# Patient Record
Sex: Female | Born: 1955 | Race: White | Hispanic: No | Marital: Married | Smoking: Never smoker
Health system: Southern US, Community
[De-identification: ages and names within clinical notes are randomized; demographics above are authoritative.]

## PROBLEM LIST (undated history)

## (undated) DIAGNOSIS — Z9889 Other specified postprocedural states: Secondary | ICD-10-CM

## (undated) DIAGNOSIS — L570 Actinic keratosis: Secondary | ICD-10-CM

## (undated) DIAGNOSIS — M1990 Unspecified osteoarthritis, unspecified site: Secondary | ICD-10-CM

## (undated) DIAGNOSIS — I10 Essential (primary) hypertension: Secondary | ICD-10-CM

## (undated) DIAGNOSIS — E785 Hyperlipidemia, unspecified: Secondary | ICD-10-CM

## (undated) DIAGNOSIS — R112 Nausea with vomiting, unspecified: Secondary | ICD-10-CM

## (undated) DIAGNOSIS — I6529 Occlusion and stenosis of unspecified carotid artery: Secondary | ICD-10-CM

## (undated) DIAGNOSIS — M810 Age-related osteoporosis without current pathological fracture: Secondary | ICD-10-CM

## (undated) DIAGNOSIS — I509 Heart failure, unspecified: Secondary | ICD-10-CM

## (undated) HISTORY — DX: Actinic keratosis: L57.0

## (undated) HISTORY — PX: EYE SURGERY: SHX253

## (undated) HISTORY — PX: ABDOMINAL HYSTERECTOMY: SHX81

---

## 1989-12-15 DIAGNOSIS — C439 Malignant melanoma of skin, unspecified: Secondary | ICD-10-CM

## 1989-12-15 HISTORY — DX: Malignant melanoma of skin, unspecified: C43.9

## 2003-06-07 ENCOUNTER — Encounter: Payer: Self-pay | Admitting: General Surgery

## 2003-06-07 ENCOUNTER — Encounter: Admission: RE | Admit: 2003-06-07 | Discharge: 2003-06-07 | Payer: Self-pay | Admitting: General Surgery

## 2006-08-28 ENCOUNTER — Ambulatory Visit: Payer: Self-pay | Admitting: Oncology

## 2007-08-26 ENCOUNTER — Ambulatory Visit: Payer: Self-pay | Admitting: Internal Medicine

## 2007-09-10 ENCOUNTER — Ambulatory Visit: Payer: Self-pay | Admitting: Internal Medicine

## 2008-05-15 ENCOUNTER — Ambulatory Visit: Payer: Self-pay | Admitting: Oncology

## 2008-07-15 ENCOUNTER — Other Ambulatory Visit: Payer: Self-pay

## 2008-07-15 ENCOUNTER — Emergency Department: Payer: Self-pay | Admitting: Emergency Medicine

## 2008-10-05 ENCOUNTER — Ambulatory Visit: Payer: Self-pay | Admitting: Internal Medicine

## 2009-11-01 ENCOUNTER — Ambulatory Visit: Payer: Self-pay | Admitting: Internal Medicine

## 2010-11-04 ENCOUNTER — Ambulatory Visit: Payer: Self-pay | Admitting: Unknown Physician Specialty

## 2011-05-14 ENCOUNTER — Other Ambulatory Visit: Payer: Self-pay | Admitting: Internal Medicine

## 2011-11-13 ENCOUNTER — Ambulatory Visit: Payer: Self-pay | Admitting: Family Medicine

## 2012-11-17 ENCOUNTER — Ambulatory Visit: Payer: Self-pay | Admitting: Family Medicine

## 2012-12-15 DIAGNOSIS — Z8679 Personal history of other diseases of the circulatory system: Secondary | ICD-10-CM

## 2012-12-15 HISTORY — DX: Personal history of other diseases of the circulatory system: Z86.79

## 2013-11-18 ENCOUNTER — Ambulatory Visit: Payer: Self-pay | Admitting: Internal Medicine

## 2013-11-27 ENCOUNTER — Ambulatory Visit: Payer: Self-pay

## 2019-10-10 ENCOUNTER — Other Ambulatory Visit: Payer: Self-pay | Admitting: Family Medicine

## 2019-10-10 DIAGNOSIS — Z1231 Encounter for screening mammogram for malignant neoplasm of breast: Secondary | ICD-10-CM

## 2019-10-10 DIAGNOSIS — N6459 Other signs and symptoms in breast: Secondary | ICD-10-CM

## 2019-10-17 ENCOUNTER — Ambulatory Visit
Admission: RE | Admit: 2019-10-17 | Discharge: 2019-10-17 | Disposition: A | Discharge status: Home / Self Care | Payer: Medicare Other | Source: Ambulatory Visit | Attending: Family Medicine | Admitting: Family Medicine

## 2019-10-17 ENCOUNTER — Encounter: Payer: Self-pay | Admitting: Radiology

## 2019-10-17 DIAGNOSIS — N6459 Other signs and symptoms in breast: Secondary | ICD-10-CM

## 2020-03-13 ENCOUNTER — Other Ambulatory Visit: Payer: Self-pay

## 2020-03-13 ENCOUNTER — Ambulatory Visit: Payer: Medicare Other | Admitting: Dermatology

## 2020-03-13 ENCOUNTER — Encounter: Payer: Self-pay | Admitting: Dermatology

## 2020-03-13 DIAGNOSIS — D225 Melanocytic nevi of trunk: Secondary | ICD-10-CM

## 2020-03-13 DIAGNOSIS — D1801 Hemangioma of skin and subcutaneous tissue: Secondary | ICD-10-CM

## 2020-03-13 DIAGNOSIS — D229 Melanocytic nevi, unspecified: Secondary | ICD-10-CM

## 2020-03-13 DIAGNOSIS — L820 Inflamed seborrheic keratosis: Secondary | ICD-10-CM | POA: Diagnosis not present

## 2020-03-13 DIAGNOSIS — Z1283 Encounter for screening for malignant neoplasm of skin: Secondary | ICD-10-CM | POA: Diagnosis not present

## 2020-03-13 DIAGNOSIS — Z85820 Personal history of malignant melanoma of skin: Secondary | ICD-10-CM

## 2020-03-13 DIAGNOSIS — L821 Other seborrheic keratosis: Secondary | ICD-10-CM

## 2020-03-13 DIAGNOSIS — L814 Other melanin hyperpigmentation: Secondary | ICD-10-CM

## 2020-03-13 DIAGNOSIS — D2371 Other benign neoplasm of skin of right lower limb, including hip: Secondary | ICD-10-CM

## 2020-03-13 DIAGNOSIS — L578 Other skin changes due to chronic exposure to nonionizing radiation: Secondary | ICD-10-CM

## 2020-03-13 NOTE — Patient Instructions (Signed)
ABCDEs of mole observation discussed.  RTC if any changes noted.  Discussed photoprotection and regular use of broad-spectrum spf 30+ sunscreen.  The nature of sun-induced photo-aging and skin cancers is discussed.  Sun avoidance, protective clothing, and the use of 30-SPF sunscreens is advised. Observe closely for skin damage/changes, and call if such occurs.

## 2020-03-13 NOTE — Progress Notes (Signed)
   Follow-Up Visit   Subjective  Kristy Ford is a 64 y.o. female who presents for the following: Annual Exam (spot on forehead, back of neck and left eyebrow).  Spot on neck is itchy at times.      The following portions of the chart were reviewed this encounter and updated as appropriate:     Review of Systems: No other skin or systemic complaints.  Objective  Well appearing patient in no apparent distress; mood and affect are within normal limits.  A full examination was performed including scalp, head, eyes, ears, nose, lips, neck, chest, axillae, abdomen, back, buttocks, bilateral upper extremities, bilateral lower extremities, hands, feet, fingers, toes, fingernails, and toenails. All findings within normal limits unless otherwise noted below.  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  Itches occasionally post neck 6mm posterior neck  Objective  Left Lower Back: 1cm pink/brown fleshy papule left lower back  1.5cm pink/brown plaque right lateral breast Pink/brown papule R breast   Assessment & Plan  Hemangioma of skin trunk  Benign, observe.    History of melanoma Right Forearm - Anterior  Clear. Observe for recurrence. Call clinic for new or changing lesions.  Recommend regular skin exams, daily broad-spectrum spf 30+ sunscreen use, and photoprotection.     Inflamed seborrheic keratosis (3) Neck - Posterior, right forehead, left eye brow  Benign and will observe. Discussed cryotherapy if becomes more irritated.  Nevus Left Lower Back  Benign-appearing.  Observation.  Call clinic for new or changing moles.  Recommend daily use of broad spectrum spf 30+ sunscreen to sun-exposed areas.  ABCDEs of mole observation discussed.  RTC if any changes noted.  Discussed photoprotection and regular use of  broad-spectrum spf 30+ sunscreen.   Skin cancer screening performed today.   Seborrheic Keratoses - Stuck-on, waxy, tan-brown papules and plaques  - Discussed benign etiology and prognosis. - Observe - Call for any changes  Lentigines - Scattered tan macules - Discussed due to sun exposure - Benign, observe - Call for any changes  Melanocytic Nevi - Tan-brown and/or pink-flesh-colored symmetric macules and papules - Benign appearing on exam today - Observation - Call clinic for new or changing moles - Recommend daily use of broad spectrum spf 30+ sunscreen to sun-exposed areas.   Dermatofibroma - Firm pink/brown papulenodule with dimple sign - Benign appearing, right thigh  - Call for any changes   Actinic Damage - diffuse scaly erythematous macules with underlying dyspigmentation - Recommend daily broad spectrum sunscreen SPF 30+ to sun-exposed areas, reapply every 2 hours as needed.  - Call for new or changing lesions.   Return in about 1 year (around 03/13/2021) for TBSE.   I, Donzetta Kohut, CMA, am acting as scribe for Brendolyn Patty, MD .

## 2020-07-10 ENCOUNTER — Other Ambulatory Visit: Payer: Self-pay

## 2020-07-10 ENCOUNTER — Ambulatory Visit: Payer: Medicare Other | Admitting: Dermatology

## 2020-07-10 ENCOUNTER — Encounter: Payer: Self-pay | Admitting: Dermatology

## 2020-07-10 DIAGNOSIS — L720 Epidermal cyst: Secondary | ICD-10-CM

## 2020-07-10 DIAGNOSIS — L820 Inflamed seborrheic keratosis: Secondary | ICD-10-CM

## 2020-07-10 NOTE — Progress Notes (Signed)
   Follow-Up Visit   Subjective  Kristy Ford is a 64 y.o. female who presents for the following: Area of concern (Right shoulder, back and right chest). Patient presents today  For a few areas of concern to be evaluated.  PCP noticed a scaly area on right shoulder, has a cyst on her back and an area of concern on her right chest. Patient states that she does have a history of MM.  Consult from Dr. Hortencia Pilar  The following portions of the chart were reviewed this encounter and updated as appropriate:  Allergies  Meds  Problems  Med Hx  Surg Hx  Fam Hx     Review of Systems:  No other skin or systemic complaints except as noted in HPI or Assessment and Plan.  Objective  Well appearing patient in no apparent distress; mood and affect are within normal limits.  A focused examination was performed including Right shoulder, back, and right chest. Relevant physical exam findings are noted in the Assessment and Plan.  Objective  Right Upper Back  x 1, Right Posterior Shoulder x 1  and Right Chest x 1 (3): Erythematous keratotic or waxy stuck-on papule or plaque.   Objective  Left Mid back 4 cm lateral to the spine: Subcutaneous nodule.    Assessment & Plan    Inflamed seborrheic keratosis (3) Right Upper Back  x 1, Right Posterior Shoulder x 1  and Right Chest x 1  Cryotherapy today Prior to procedure, discussed risks of blister formation, small wound, skin dyspigmentation, or rare scar following cryotherapy.    Destruction of lesion - Right Upper Back  x 1, Right Posterior Shoulder x 1  and Right Chest x 1 Complexity: simple   Destruction method: cryotherapy   Informed consent: discussed and consent obtained   Timeout:  patient name, date of birth, surgical site, and procedure verified Lesion destroyed using liquid nitrogen: Yes   Region frozen until ice ball extended beyond lesion: Yes   Outcome: patient tolerated procedure well with no complications     Post-procedure details: wound care instructions given    Epidermal inclusion cyst Left Mid back 4 cm lateral to the spine  Cyst with symptoms and/or recent change.  Discussed surgical excision to remove, including resulting scar and possible recurrence.  Patient will schedule for surgery. Pre-op information given. Cyst has h/o rupture and drainage   Return for Surgery for Cyst.  I, Donzetta Kohut, CMA, am acting as scribe for Sarina Ser, MD . Documentation: I have reviewed the above documentation for accuracy and completeness, and I agree with the above.  Sarina Ser, MD

## 2020-07-10 NOTE — Patient Instructions (Addendum)
Pre-Operative Instructions  You are scheduled for a surgical procedure at [facility]. We recommend you read the following instructions. If you have any questions or concerns, please call the office at [phone].  1. Shower and wash the entire body with soap and water the day of your surgery paying special attention to cleansing at and around the planned surgery site.  2. Avoid aspirin or aspirin containing products at least fourteen (14) days prior to your surgical procedure and for at least one week (7 Days) after your surgical procedure. If you take aspirin on a regular basis for heart disease or history of stroke or for any other reason, we may recommend you continue taking aspirin but please notify us if you take this on a regular basis. Aspirin can cause more bleeding to occur during surgery as well as prolonged bleeding and bruising after surgery.   3. Avoid other nonsteroidal pain medications at least one week prior to surgery and at least one week prior to your surgery. These include medications such as Ibuprofen (Motrin, Advil and Nuprin), Naprosyn, Voltaren, Relafen, etc. If medications are used for therapeutic reasons, please inform us as they can cause increased bleeding or prolonged bleeding during and bruising after surgical procedures.   4. Please advice [place] if you are taking any "blood thinner" medications such as Coumadin or Dipyridamole or Plavix or similar medications. These cause increased bleeding and prolonged bleeding during and bruising after surgical procedures. We may have to consider discontinuing these medications briefly prior to and shortly after your surgery, if safe to do so.   5. Please inform us of all medications you are currently taking. All medications that are taken regularly should be taken the day of surgery as you always do. Nevertheless, we need to be informed of what medications you are taking prior to surgery to whether they will affect the  procedure or cause any complications.   6. Please inform us of any medication allergies. Also inform us of whether you have allergies to Latex or rubber products or whether you have had any adverse reaction to Lidocaine or Epinephrine.  7. Please inform us of any prosthetic or artificial body parts such as artificial heart valve, joint replacements, etc., or similar condition that might require preoperative antibiotics.   8. We recommend avoidance of alcohol at least two weeks prior to surgery and continued avoidence for at least two weeks after surgery.   9. We recommend discontinuation of tobacco smoking at least two weeks prior to surgery and continued abstinence for at least two weeks after surgery.  10. Do not plan strenuous exercise, strenuous work or strenuous lifting for approximately four weeks after your surgery.   11. We request if you are unable to make your scheduled surgical appointment, please call us at least a week in advance or as soon as you are aware of a problem sot aht we can cancel or reschedule you.   12. You MAKE TAKE TYLENOL (acetaminophen) for pain as it is not a blood thinner.   PLEASE PLAN TO BE IN TOWN FOR TWO WEEKS FOLLOWING SURGERY, THIS IS IMPORTANT SO YOU CAN BE CHECKED FOR DRESSING CHANGES, SUTURE REMOVAL AND TO MONITOR FOR POSSIBLE COMPLICATIONS. Recommend daily broad spectrum sunscreen SPF 30+ to sun-exposed areas, reapply every 2 hours as needed. Call for new or changing lesions.  Prior to procedure, discussed risks of blister formation, small wound, skin dyspigmentation, or rare scar following cryotherapy.  Liquid nitrogen was applied for 10-12 seconds  to the skin lesion and the expected blistering or scabbing reaction explained. Do not pick at the area. Patient reminded to expect hypopigmented scars from the procedure. Return if lesion fails to fully resolve.  Cryotherapy Aftercare  . Wash gently with soap and water everyday.   Marland Kitchen Apply Vaseline and  Band-Aid daily until healed.

## 2020-07-13 ENCOUNTER — Encounter: Payer: Self-pay | Admitting: Dermatology

## 2020-08-07 ENCOUNTER — Ambulatory Visit: Payer: Medicare Other | Admitting: Dermatology

## 2020-08-07 ENCOUNTER — Telehealth: Payer: Self-pay

## 2020-08-07 ENCOUNTER — Other Ambulatory Visit: Payer: Self-pay

## 2020-08-07 DIAGNOSIS — D485 Neoplasm of uncertain behavior of skin: Secondary | ICD-10-CM

## 2020-08-07 DIAGNOSIS — Z1283 Encounter for screening for malignant neoplasm of skin: Secondary | ICD-10-CM

## 2020-08-07 DIAGNOSIS — L720 Epidermal cyst: Secondary | ICD-10-CM

## 2020-08-07 DIAGNOSIS — L821 Other seborrheic keratosis: Secondary | ICD-10-CM

## 2020-08-07 DIAGNOSIS — D229 Melanocytic nevi, unspecified: Secondary | ICD-10-CM | POA: Diagnosis not present

## 2020-08-07 DIAGNOSIS — L820 Inflamed seborrheic keratosis: Secondary | ICD-10-CM | POA: Diagnosis not present

## 2020-08-07 DIAGNOSIS — D1800 Hemangioma unspecified site: Secondary | ICD-10-CM

## 2020-08-07 DIAGNOSIS — L814 Other melanin hyperpigmentation: Secondary | ICD-10-CM

## 2020-08-07 DIAGNOSIS — Z85820 Personal history of malignant melanoma of skin: Secondary | ICD-10-CM

## 2020-08-07 DIAGNOSIS — L578 Other skin changes due to chronic exposure to nonionizing radiation: Secondary | ICD-10-CM

## 2020-08-07 MED ORDER — MUPIROCIN 2 % EX OINT: 1.0000 "application " | TOPICAL_OINTMENT | Freq: Every day | CUTANEOUS | 0 refills | Status: AC

## 2020-08-07 NOTE — Progress Notes (Signed)
Follow-Up Visit   Subjective  Kristy Ford is a 64 y.o. female who presents for the following: Annual Exam (Hx MM L forearm treated in 1991) and surgery (excision of cyst on the L mid back ). The patient presents for Total-Body Skin Exam (TBSE) for skin cancer screening and mole check.  The following portions of the chart were reviewed this encounter and updated as appropriate:  Allergies  Meds  Problems  Med Hx  Surg Hx  Fam Hx     Review of Systems:  No other skin or systemic complaints except as noted in HPI or Assessment and Plan.  Objective  Well appearing patient in no apparent distress; mood and affect are within normal limits.  A full examination was performed including scalp, head, eyes, ears, nose, lips, neck, chest, axillae, abdomen, back, buttocks, bilateral upper extremities, bilateral lower extremities, hands, feet, fingers, toes, fingernails, and toenails. All findings within normal limits unless otherwise noted below.  Objective  L mid back 4.0 cm lat to spine: 1.3 x 0.8 cm firm SQ nodule   Objective  L thigh, R sup forehead, R thigh (2): Erythematous keratotic or waxy stuck-on papule or plaque.   Assessment & Plan  Neoplasm of uncertain behavior of skin L mid back 4.0 cm lat to spine  Skin excision  Lesion length (cm):  1.3 Lesion width (cm):  0.8 Margin per side (cm):  0 Total excision diameter (cm):  1.3 Informed consent: discussed and consent obtained   Timeout: patient name, date of birth, surgical site, and procedure verified   Procedure prep:  Patient was prepped and draped in usual sterile fashion Prep type:  Isopropyl alcohol and povidone-iodine Anesthesia: the lesion was anesthetized in a standard fashion   Anesthetic:  1% lidocaine w/ epinephrine 1-100,000 buffered w/ 8.4% NaHCO3 Hemostasis achieved with: pressure   Hemostasis achieved with comment:  Electrocautery Outcome: patient tolerated procedure well with no complications     Post-procedure details: sterile dressing applied and wound care instructions given   Dressing type: bandage and pressure dressing    Skin repair Complexity:  Complex Final length (cm):  3.5 Informed consent: discussed and consent obtained   Timeout: patient name, date of birth, surgical site, and procedure verified   Procedure prep:  Patient was prepped and draped in usual sterile fashion Prep type:  Povidone-iodine Anesthesia: the lesion was anesthetized in a standard fashion   Reason for type of repair: reduce tension to allow closure, reduce the risk of dehiscence, infection, and necrosis, reduce subcutaneous dead space and avoid a hematoma, allow closure of the large defect, preserve normal anatomy, preserve normal anatomical and functional relationships and enhance both functionality and cosmetic results   Undermining comment:  1.5cm Subcutaneous layers (deep stitches):  Suture size:  2-0 Suture type: Vicryl (polyglactin 910)   Fine/surface layer approximation (top stitches):  Suture size:  3-0 Stitches: simple running   Hemostasis achieved with: suture and pressure Outcome: patient tolerated procedure well with no complications   Post-procedure details: sterile dressing applied and wound care instructions given   Dressing type: bandage and pressure dressing    mupirocin ointment (BACTROBAN) 2 %  Specimen 1 - Surgical pathology Differential Diagnosis: D48.5 r/o cyst vs other  Check Margins: Yes 1.3 x 0.8 cm firm SQ nodule  Inflamed Scarred cyst with hx of rupture and drainage  Start Mupirocin 2% oint to aa QD after wound cleansing. 22g 0Rf.   Inflamed seborrheic keratosis (2) L thigh, R sup forehead,  R thigh  Destruction of lesion - L thigh, R sup forehead, R thigh Complexity: simple   Destruction method: cryotherapy   Informed consent: discussed and consent obtained   Timeout:  patient name, date of birth, surgical site, and procedure verified Lesion destroyed using  liquid nitrogen: Yes   Region frozen until ice ball extended beyond lesion: Yes   Outcome: patient tolerated procedure well with no complications   Post-procedure details: wound care instructions given     Lentigines - Scattered tan macules - Discussed due to sun exposure - Benign, observe - Call for any changes  Seborrheic Keratoses - Stuck-on, waxy, tan-brown papules and plaques  - Discussed benign etiology and prognosis. - Observe - Call for any changes  Melanocytic Nevi - Tan-brown and/or pink-flesh-colored symmetric macules and papules - Benign appearing on exam today - Observation - Call clinic for new or changing moles - Recommend daily use of broad spectrum spf 30+ sunscreen to sun-exposed areas.   Hemangiomas - Red papules - Discussed benign nature - Observe - Call for any changes  Actinic Damage - diffuse scaly erythematous macules with underlying dyspigmentation - Recommend daily broad spectrum sunscreen SPF 30+ to sun-exposed areas, reapply every 2 hours as needed.  - Call for new or changing lesions.  History of Melanoma - L forearm treated in 1991 - No evidence of recurrence today - No lymphadenopathy - Recommend regular full body skin exams - Recommend daily broad spectrum sunscreen SPF 30+ to sun-exposed areas, reapply every 2 hours as needed.  - Call if any new or changing lesions are noted between office visits  Skin cancer screening performed today.  Return in about 1 week (around 08/14/2020) for nurse visit - suture removal .  I, [name], CMA, am acting as scribe for Sarina Ser, MD .  Documentation: I have reviewed the above documentation for accuracy and completeness, and I agree with the above.  Sarina Ser, MD

## 2020-08-07 NOTE — Telephone Encounter (Signed)
Patient states that she is doing well after surgery. I advised her to contact our office should any problems arise.

## 2020-08-14 ENCOUNTER — Ambulatory Visit (INDEPENDENT_AMBULATORY_CARE_PROVIDER_SITE_OTHER): Payer: Medicare Other | Admitting: Dermatology

## 2020-08-14 ENCOUNTER — Other Ambulatory Visit: Payer: Self-pay

## 2020-08-14 ENCOUNTER — Encounter: Payer: Self-pay | Admitting: Dermatology

## 2020-08-14 DIAGNOSIS — Z4802 Encounter for removal of sutures: Secondary | ICD-10-CM

## 2020-08-14 DIAGNOSIS — L720 Epidermal cyst: Secondary | ICD-10-CM

## 2020-08-14 NOTE — Progress Notes (Signed)
   Follow-Up Visit   Subjective  Kristy Ford is a 64 y.o. female who presents for the following: Suture / Staple Removal (left mid back 4.0cm lat to spine, benign cyst, margins free).  The following portions of the chart were reviewed this encounter and updated as appropriate: Allergies  Meds  Problems  Med Hx  Surg Hx  Fam Hx     Review of Systems: No other skin or systemic complaints except as noted in HPI or Assessment and Plan.   Objective  Well appearing patient in no apparent distress; mood and affect are within normal limits.  A focused examination was performed including back. Relevant physical exam findings are noted in the Assessment and Plan.  Objective  left mid back 4.0cm lat to spine: Benign cyst  Assessment & Plan  Epidermoid cyst left mid back 4.0cm lat to spine  Encounter for Removal of Sutures - Incision site at the mid back is clean, dry and intact - Wound cleansed, sutures removed, wound cleansed and steri strips applied.  - Discussed pathology results showing benign cyst, margins free.  - Patient advised to keep steri-strips dry until they fall off. - Scars remodel for a full year. - Once steri-strips fall off, patient can apply over-the-counter silicone scar cream each night to help with scar remodeling if desired. - Patient advised to call with any concerns or if they notice any new or changing lesions.   Return in about 1 year (around 08/14/2021) for tbse.   IHarriett Sine, CMA, am acting as scribe for Sarina Ser, MD.  Documentation: I have reviewed the above documentation for accuracy and completeness, and I agree with the above.  Sarina Ser, MD

## 2020-08-14 NOTE — Patient Instructions (Signed)

## 2020-08-15 ENCOUNTER — Telehealth: Payer: Self-pay

## 2020-08-15 NOTE — Telephone Encounter (Signed)
Opened in error

## 2020-08-18 ENCOUNTER — Encounter: Payer: Self-pay | Admitting: Dermatology

## 2020-10-30 IMAGING — US US BREAST*R* LIMITED INC AXILLA
1 series · 3 of 3 positions shown · non-contrast
Comparison: Previous exam(s).

CLINICAL DATA: Patient presents for evaluation of intermittent red
bumps under the right and left axilla. Patient states that they come
and go every 2-3 days. Patient states no bumps are currently
present.

EXAM:
DIGITAL DIAGNOSTIC BILATERAL MAMMOGRAM WITH CAD AND TOMO
ULTRASOUND BILATERAL BREAST

[Series 1: us breast*right* limited inc axilla · 0.08mm/px · 3 of 3 slices shown]
[im 1/3]
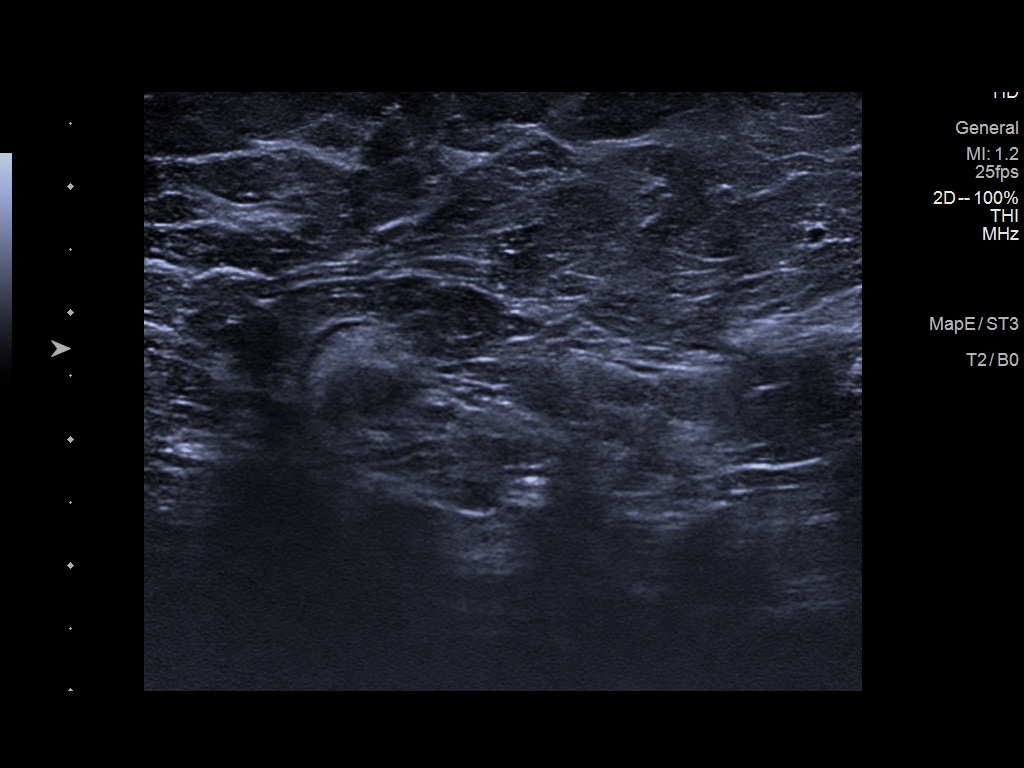
[im 2/3]
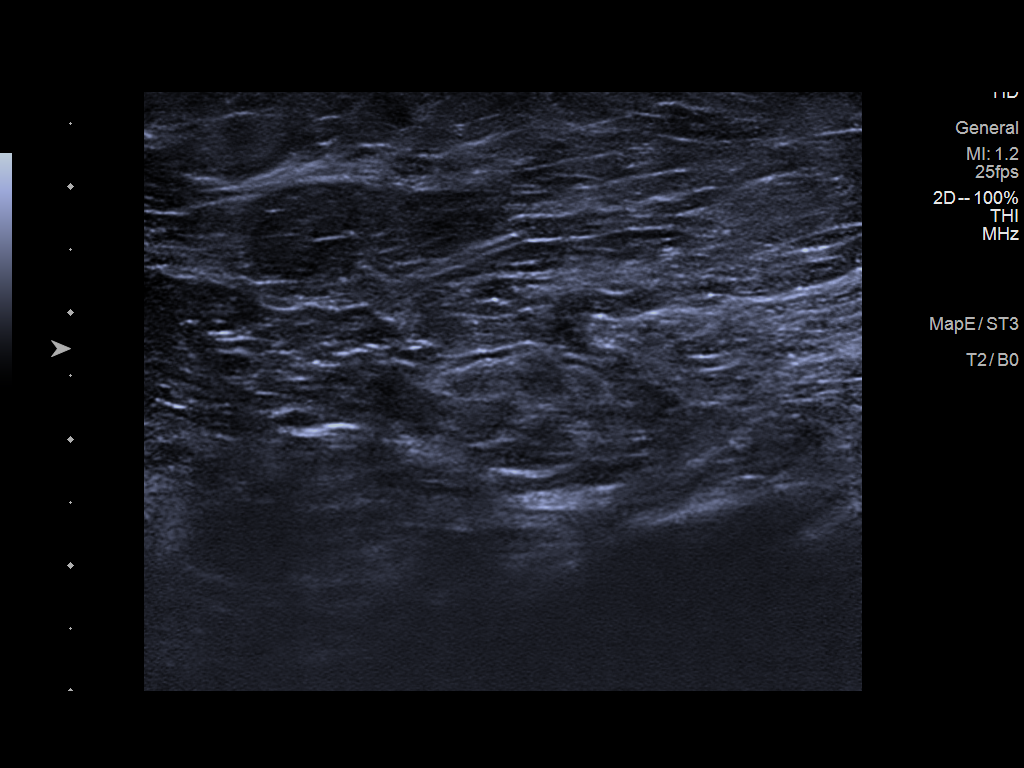
[im 3/3]
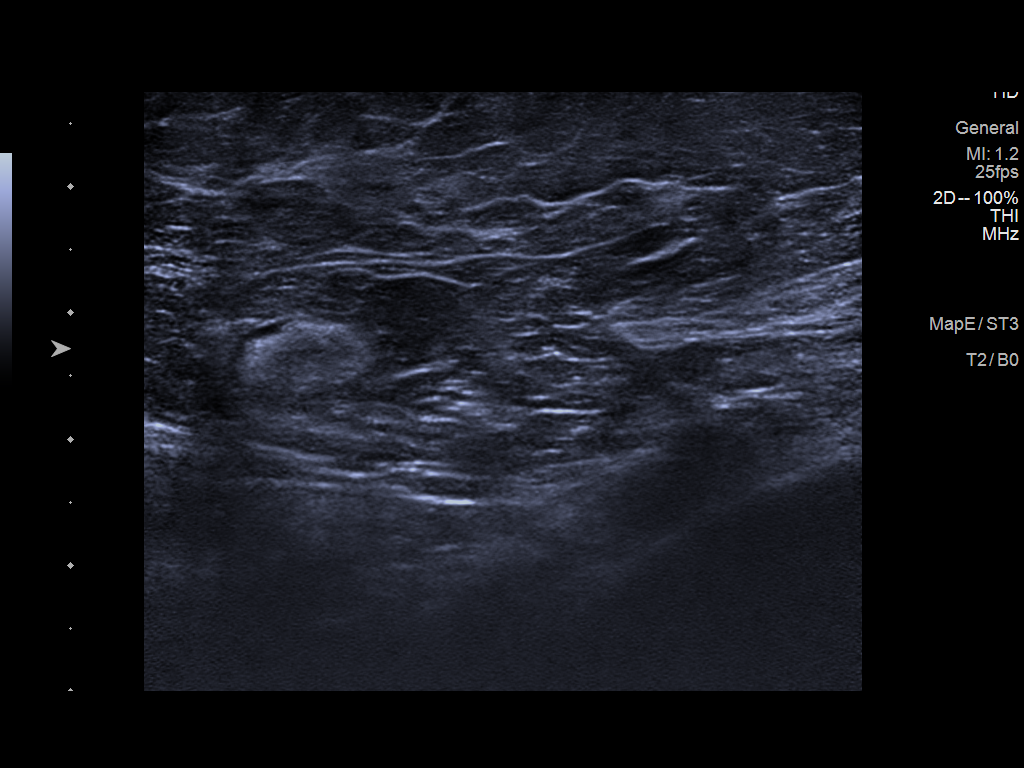

[3 of 3 positions shown; findings below may reference images not displayed]

ACR Breast Density Category c: The breast tissue is heterogeneously
dense, which may obscure small masses.
FINDINGS: No concerning masses, calcifications or nonsurgical distortion
identified within either breast.

Mammographic images were processed with CAD.

On physical exam, no red cutaneous bumps are identified under the
right or left axilla.

Targeted ultrasound is performed, showing normal axillary contents
bilaterally. No suspicious mass identified.
IMPRESSION: No mammographic evidence for malignancy.

RECOMMENDATION:
Continued clinical evaluation for reported bilateral axillary red
bumps which have subsequently resolved.

Screening mammogram in one year.(Code:[id number]-Z-3P6)

I have discussed the findings and recommendations with the patient.
If applicable, a reminder letter will be sent to the patient
regarding the next appointment.

BI-RADS CATEGORY  1: Negative.

## 2020-10-30 IMAGING — MG DIGITAL DIAGNOSTIC BILAT W/ TOMO
8 series · 8 of 24 positions shown · non-contrast
Comparison: Previous exam(s).

CLINICAL DATA: Patient presents for evaluation of intermittent red
bumps under the right and left axilla. Patient states that they come
and go every 2-3 days. Patient states no bumps are currently
present.

EXAM:
DIGITAL DIAGNOSTIC BILATERAL MAMMOGRAM WITH CAD AND TOMO
ULTRASOUND BILATERAL BREAST

[L CC synth-2D]
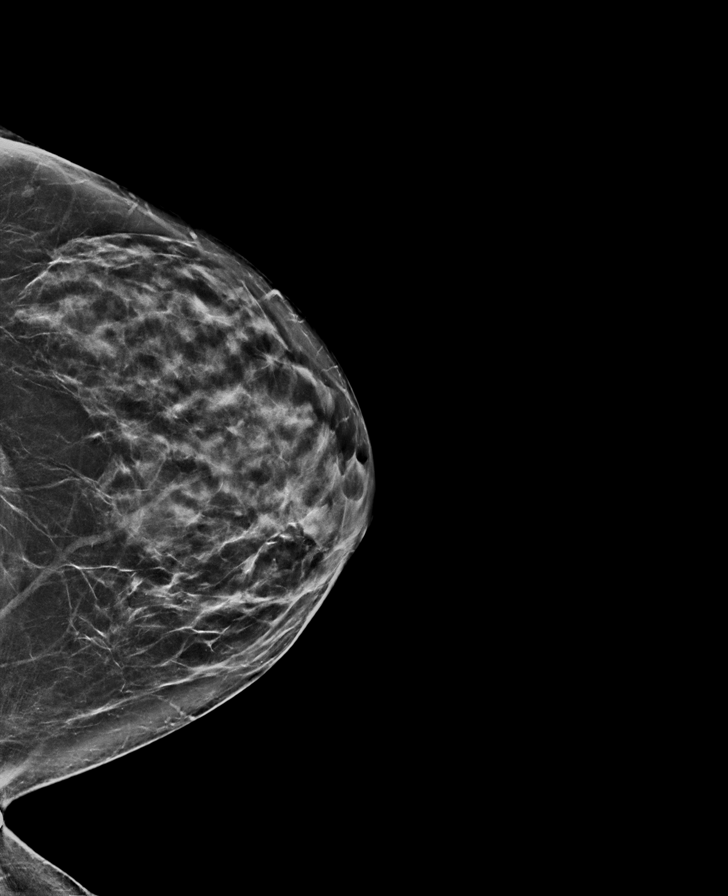

[R MLO synth-2D]
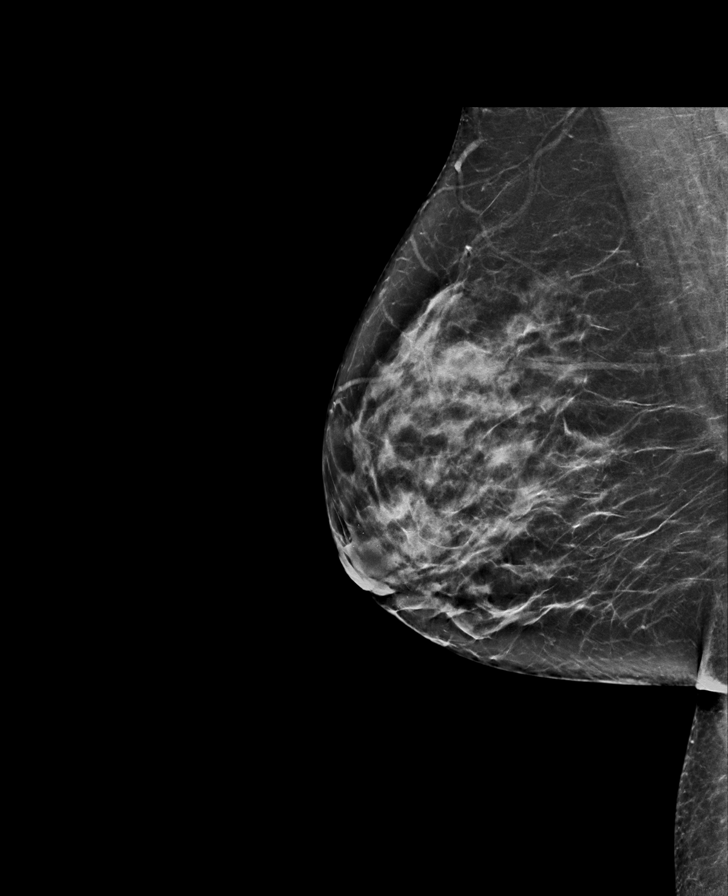

[R CC synth-2D]
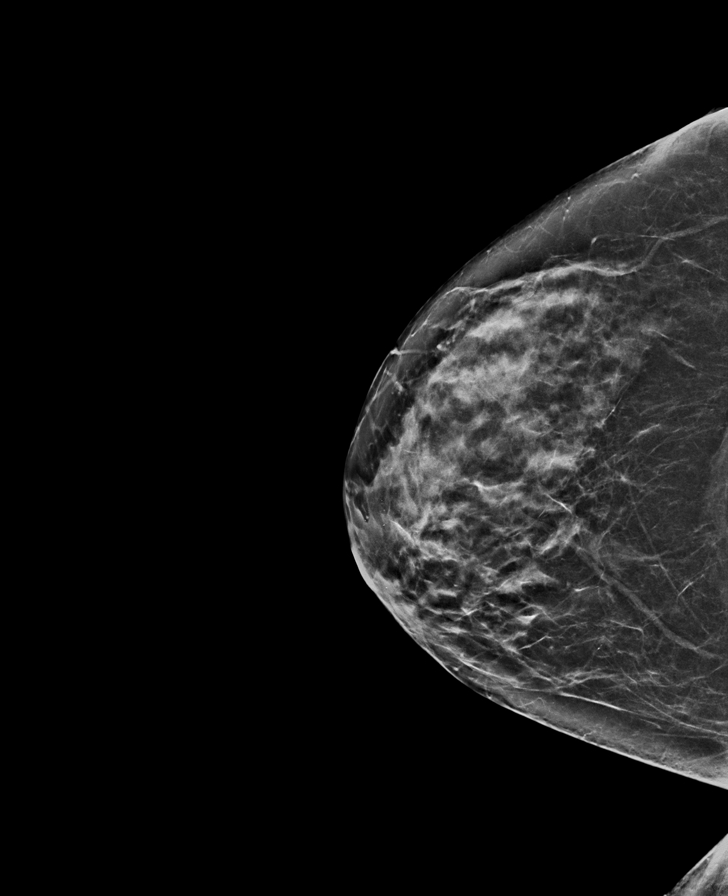

[L MLO synth-2D]
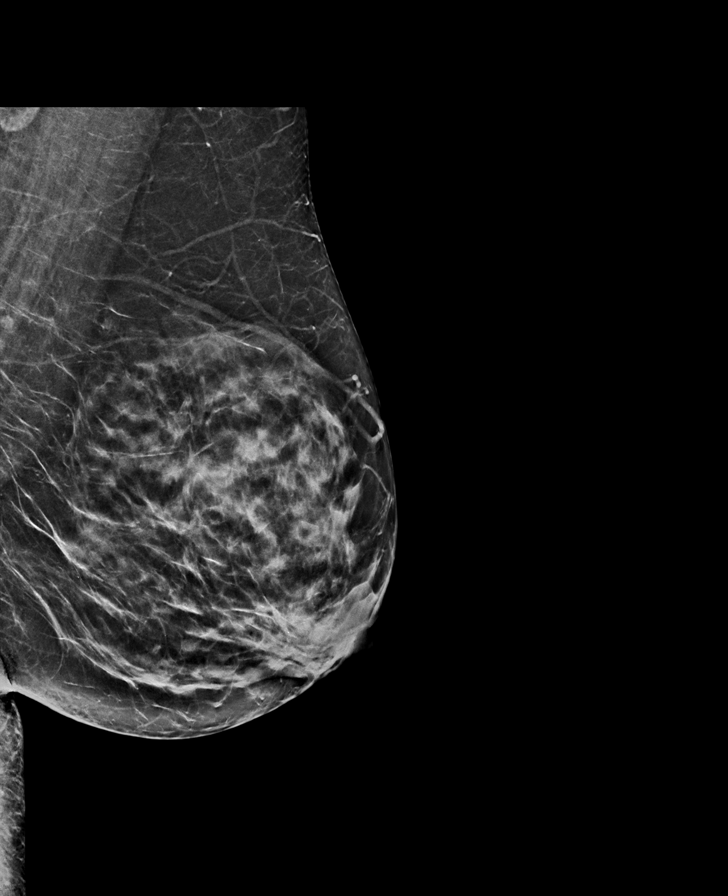

[L CC tomo · tomo slice 37/72.0]
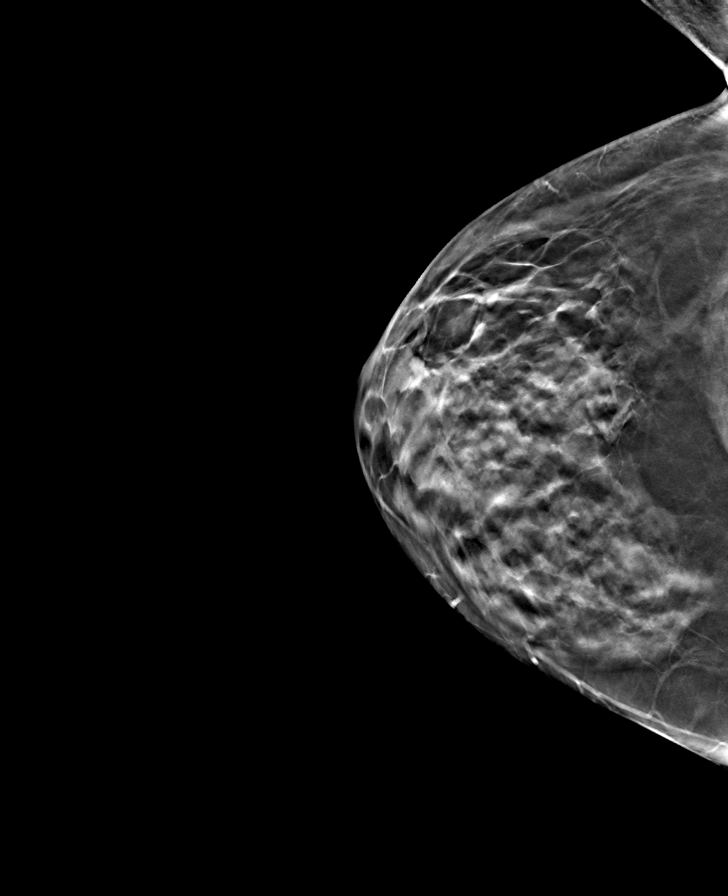

[R CC tomo · tomo slice 33/65.0]
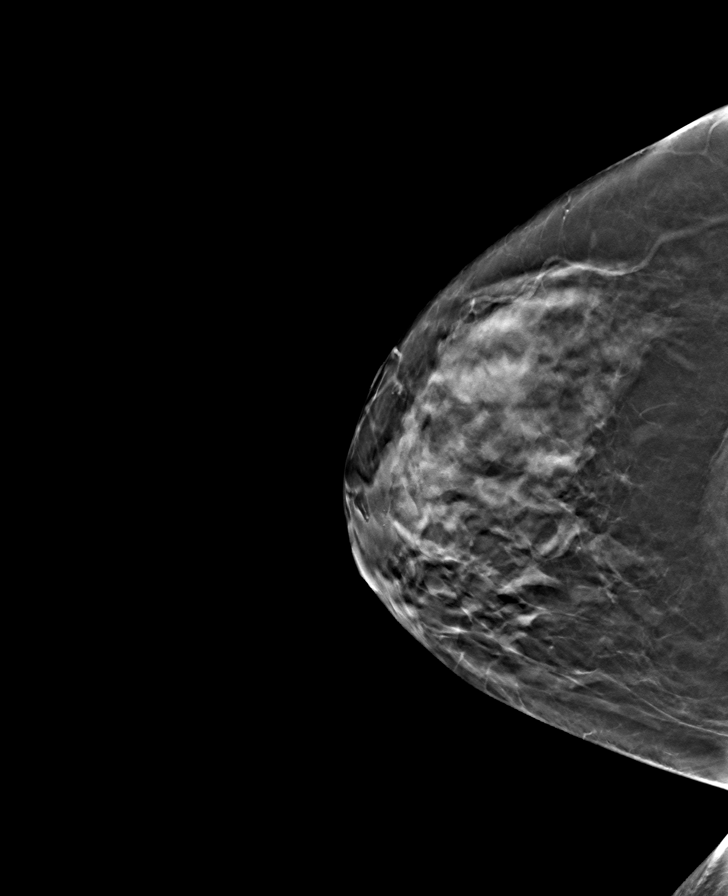

[R MLO tomo · tomo slice 35/70.0]
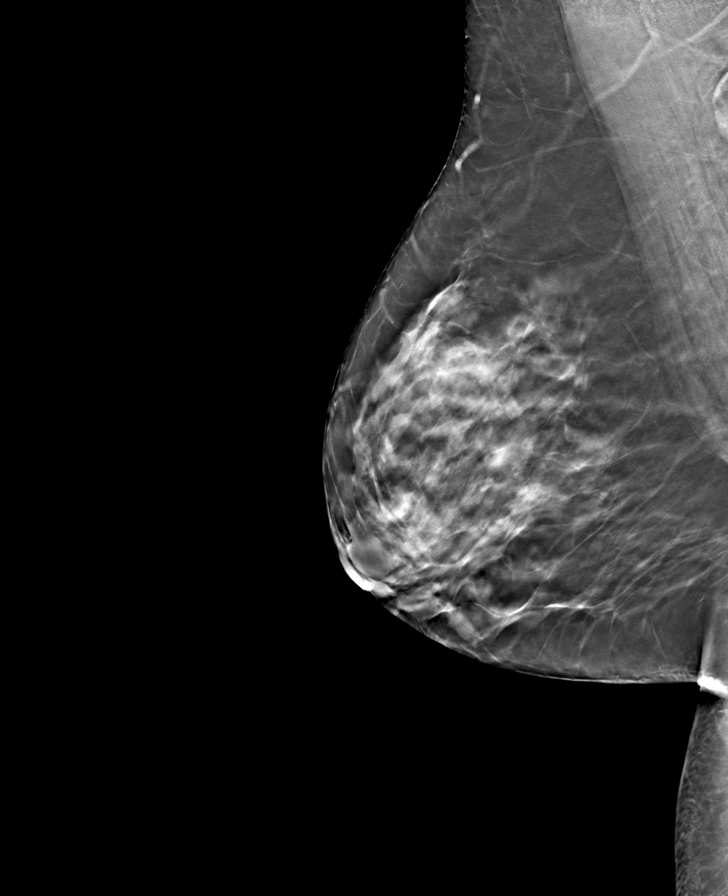

[L MLO tomo · tomo slice 35/68.0]
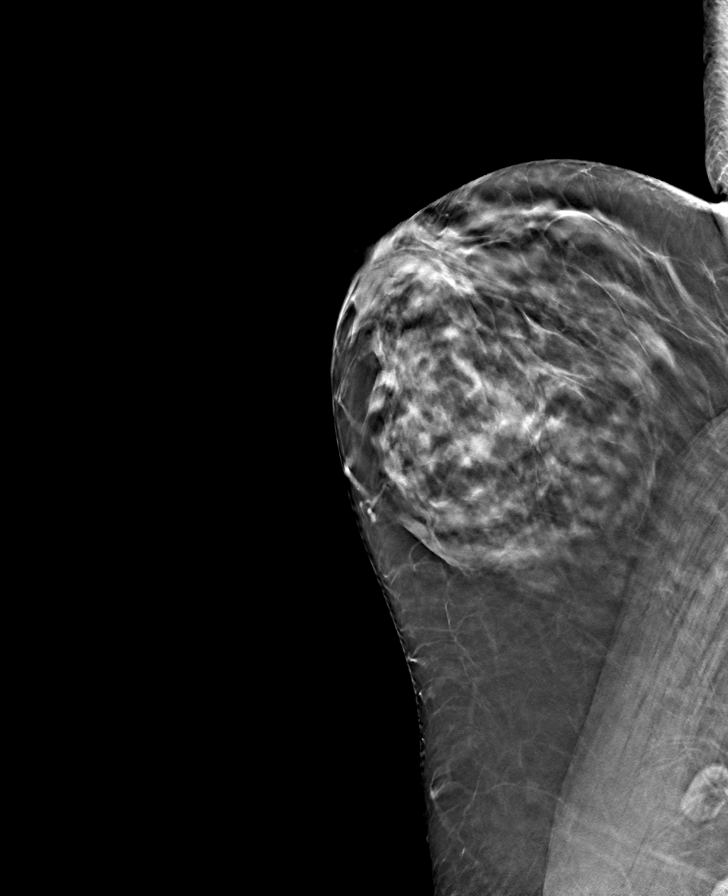

[8 of 24 positions shown; findings below may reference images not displayed]

ACR Breast Density Category c: The breast tissue is heterogeneously
dense, which may obscure small masses.
FINDINGS: No concerning masses, calcifications or nonsurgical distortion
identified within either breast.

Mammographic images were processed with CAD.

On physical exam, no red cutaneous bumps are identified under the
right or left axilla.

Targeted ultrasound is performed, showing normal axillary contents
bilaterally. No suspicious mass identified.
IMPRESSION: No mammographic evidence for malignancy.

RECOMMENDATION:
Continued clinical evaluation for reported bilateral axillary red
bumps which have subsequently resolved.

Screening mammogram in one year.(Code:LB-Z-3P6)

I have discussed the findings and recommendations with the patient.
If applicable, a reminder letter will be sent to the patient
regarding the next appointment.

BI-RADS CATEGORY  1: Negative.

## 2020-11-29 ENCOUNTER — Other Ambulatory Visit: Payer: Self-pay | Admitting: Family Medicine

## 2020-12-04 ENCOUNTER — Other Ambulatory Visit: Payer: Self-pay | Admitting: Family Medicine

## 2020-12-04 DIAGNOSIS — Z1231 Encounter for screening mammogram for malignant neoplasm of breast: Secondary | ICD-10-CM

## 2020-12-10 ENCOUNTER — Ambulatory Visit
Admission: RE | Admit: 2020-12-10 | Discharge: 2020-12-10 | Disposition: A | Discharge status: Home / Self Care | Payer: Medicare Other | Source: Ambulatory Visit | Attending: Family Medicine | Admitting: Family Medicine

## 2020-12-10 ENCOUNTER — Other Ambulatory Visit: Payer: Self-pay

## 2020-12-10 DIAGNOSIS — Z1231 Encounter for screening mammogram for malignant neoplasm of breast: Secondary | ICD-10-CM | POA: Diagnosis present

## 2021-03-19 ENCOUNTER — Encounter: Payer: Medicare Other | Admitting: Dermatology

## 2021-03-20 ENCOUNTER — Ambulatory Visit: Payer: Medicare Other | Admitting: Dermatology

## 2021-03-20 ENCOUNTER — Encounter: Payer: Self-pay | Admitting: Dermatology

## 2021-03-20 ENCOUNTER — Other Ambulatory Visit: Payer: Self-pay

## 2021-03-20 DIAGNOSIS — Z85820 Personal history of malignant melanoma of skin: Secondary | ICD-10-CM

## 2021-03-20 DIAGNOSIS — L821 Other seborrheic keratosis: Secondary | ICD-10-CM

## 2021-03-20 DIAGNOSIS — L570 Actinic keratosis: Secondary | ICD-10-CM | POA: Diagnosis not present

## 2021-03-20 DIAGNOSIS — Z1283 Encounter for screening for malignant neoplasm of skin: Secondary | ICD-10-CM | POA: Diagnosis not present

## 2021-03-20 DIAGNOSIS — D2371 Other benign neoplasm of skin of right lower limb, including hip: Secondary | ICD-10-CM

## 2021-03-20 DIAGNOSIS — L814 Other melanin hyperpigmentation: Secondary | ICD-10-CM

## 2021-03-20 DIAGNOSIS — D1800 Hemangioma unspecified site: Secondary | ICD-10-CM

## 2021-03-20 DIAGNOSIS — L578 Other skin changes due to chronic exposure to nonionizing radiation: Secondary | ICD-10-CM

## 2021-03-20 DIAGNOSIS — D229 Melanocytic nevi, unspecified: Secondary | ICD-10-CM

## 2021-03-20 NOTE — Patient Instructions (Addendum)

## 2021-03-20 NOTE — Progress Notes (Signed)
Follow-Up Visit   Subjective  Kristy Ford is a 65 y.o. female who presents for the following: Annual Exam (Mole check ). 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  Objective  Well appearing patient in no apparent distress; mood and affect are within normal limits.  A full examination was performed including scalp, head, eyes, ears, nose, lips, neck, chest, axillae, abdomen, back, buttocks, bilateral upper extremities, bilateral lower extremities, hands, feet, fingers, toes, fingernails, and toenails. All findings within normal limits unless otherwise noted below.  Objective  Left volar forearm: Well healed scar with no evidence of recurrence, no lymphadenopathy.   Objective  forehead at hairline x 1: Erythematous thin papules/macules with gritty scale.    Assessment & Plan  History of melanoma Left volar forearm  Hx of invasive Melanoma L volar forearm removed in 1991 no lymphadenopathy.   Clear. Observe for recurrence. Call clinic for new or changing lesions.  Recommend regular skin exams, daily broad-spectrum spf 30+ sunscreen use, and photoprotection.     AK (actinic keratosis) forehead at hairline x 1  Destruction of lesion - forehead at hairline x 1 Complexity: simple   Destruction method: cryotherapy   Informed consent: discussed and consent obtained   Timeout:  patient name, date of birth, surgical site, and procedure verified Lesion destroyed using liquid nitrogen: Yes   Region frozen until ice ball extended beyond lesion: Yes   Outcome: patient tolerated procedure well with no complications   Post-procedure details: wound care instructions given    Skin cancer screening    Lentigines - Scattered tan macules - Due to sun exposure - Benign-appering, observe - Recommend daily broad spectrum sunscreen SPF 30+ to sun-exposed areas, reapply every 2 hours as needed. - Call for any changes  Seborrheic Keratoses - Stuck-on, waxy, tan-brown papules and/or plaques  - Benign-appearing - Discussed benign etiology and prognosis. - Observe - Call for any changes  Melanocytic Nevi - Tan-brown and/or pink-flesh-colored symmetric macules and papules - Benign appearing on exam today - Observation - Call clinic for new or changing moles - Recommend daily use of broad spectrum spf 30+ sunscreen to sun-exposed areas.   Hemangiomas - Red papules - Discussed benign nature - Observe - Call for any changes  Actinic Damage - Chronic condition, secondary to cumulative UV/sun exposure - diffuse scaly erythematous macules with underlying dyspigmentation - Recommend daily broad spectrum sunscreen SPF 30+ to sun-exposed areas, reapply every 2 hours as needed.  - Staying in the shade or wearing long sleeves, sun glasses (UVA+UVB protection) and wide brim hats (4-inch brim around the entire circumference of the hat) are also recommended for sun protection.  - Call for new or changing lesions.  Dermatofibroma Right proximal medial thigh  - Firm pink/brown papulenodule with dimple sign - Benign appearing - Call for any changes  Skin cancer screening performed today.  Return in about 1 year (around 03/20/2022) for TBSE, hx of melanoma .  IMarye Round, CMA, am acting as scribe for Sarina Ser, MD .  Documentation: I have reviewed the above documentation for accuracy and completeness, and I agree with the above.  Sarina Ser,  MD   

## 2021-10-28 ENCOUNTER — Encounter: Payer: Self-pay | Admitting: Ophthalmology

## 2021-11-06 NOTE — Discharge Instructions (Signed)

## 2021-11-11 ENCOUNTER — Encounter: Payer: Self-pay | Admitting: Ophthalmology

## 2021-11-11 ENCOUNTER — Other Ambulatory Visit: Payer: Self-pay

## 2021-11-11 ENCOUNTER — Encounter
Admission: RE | Disposition: A | Discharge status: Home / Self Care | Payer: Self-pay | Source: Home / Self Care | Attending: Ophthalmology

## 2021-11-11 ENCOUNTER — Ambulatory Visit: Payer: Medicare Other | Admitting: Anesthesiology

## 2021-11-11 ENCOUNTER — Ambulatory Visit
Admission: RE | Admit: 2021-11-11 | Discharge: 2021-11-11 | Disposition: A | Discharge status: Home / Self Care | Payer: Medicare Other | Attending: Ophthalmology | Admitting: Ophthalmology

## 2021-11-11 DIAGNOSIS — H2511 Age-related nuclear cataract, right eye: Secondary | ICD-10-CM | POA: Insufficient documentation

## 2021-11-11 HISTORY — DX: Occlusion and stenosis of unspecified carotid artery: I65.29

## 2021-11-11 HISTORY — DX: Age-related osteoporosis without current pathological fracture: M81.0

## 2021-11-11 HISTORY — DX: Nausea with vomiting, unspecified: R11.2

## 2021-11-11 HISTORY — DX: Hyperlipidemia, unspecified: E78.5

## 2021-11-11 HISTORY — PX: CATARACT EXTRACTION W/PHACO: SHX586

## 2021-11-11 HISTORY — DX: Unspecified osteoarthritis, unspecified site: M19.90

## 2021-11-11 HISTORY — DX: Other specified postprocedural states: Z98.890

## 2021-11-11 HISTORY — DX: Essential (primary) hypertension: I10

## 2021-11-11 SURGERY — PHACOEMULSIFICATION, CATARACT, WITH IOL INSERTION
Anesthesia: Monitor Anesthesia Care | Site: Eye | Laterality: Right

## 2021-11-11 MED ORDER — ACETAMINOPHEN 325 MG PO TABS: 325.0000 mg | ORAL_TABLET | ORAL | Status: DC | PRN

## 2021-11-11 MED ORDER — SIGHTPATH DOSE#1 BSS IO SOLN
INTRAOCULAR | Status: DC | PRN
  Administered 2021-11-11: 15 mL

## 2021-11-11 MED ORDER — SIGHTPATH DOSE#1 SODIUM HYALURONATE 10 MG/ML IO SOLUTION
PREFILLED_SYRINGE | INTRAOCULAR | Status: DC | PRN
  Administered 2021-11-11: 0.85 mL via INTRAOCULAR

## 2021-11-11 MED ORDER — FENTANYL CITRATE (PF) 100 MCG/2ML IJ SOLN
INTRAMUSCULAR | Status: DC | PRN
  Administered 2021-11-11: 50 ug via INTRAVENOUS

## 2021-11-11 MED ORDER — SIGHTPATH DOSE#1 SODIUM HYALURONATE 23 MG/ML IO SOLUTION
PREFILLED_SYRINGE | INTRAOCULAR | Status: DC | PRN
  Administered 2021-11-11: 0.6 mL via INTRAOCULAR

## 2021-11-11 MED ORDER — ONDANSETRON HCL 4 MG/2ML IJ SOLN: 4.0000 mg | Freq: Once | INTRAMUSCULAR | Status: DC | PRN

## 2021-11-11 MED ORDER — SIGHTPATH DOSE#1 BSS IO SOLN
INTRAOCULAR | Status: DC | PRN
  Administered 2021-11-11: 11:00:00 76 mL via OPHTHALMIC

## 2021-11-11 MED ORDER — MIDAZOLAM HCL 2 MG/2ML IJ SOLN
INTRAMUSCULAR | Status: DC | PRN
  Administered 2021-11-11: 1 mg via INTRAVENOUS

## 2021-11-11 MED ORDER — TETRACAINE HCL 0.5 % OP SOLN
1.0000 [drp] | OPHTHALMIC | Status: DC | PRN
  Administered 2021-11-11 (×3): 1 [drp] via OPHTHALMIC

## 2021-11-11 MED ORDER — ACETAMINOPHEN 160 MG/5ML PO SOLN: 325.0000 mg | ORAL | Status: DC | PRN

## 2021-11-11 MED ORDER — LIDOCAINE HCL (PF) 2 % IJ SOLN
INTRAOCULAR | Status: DC | PRN
  Administered 2021-11-11: 11:00:00 1 mL via INTRAOCULAR

## 2021-11-11 MED ORDER — ARMC OPHTHALMIC DILATING DROPS
1.0000 "application " | OPHTHALMIC | Status: DC | PRN
  Administered 2021-11-11 (×3): 1 via OPHTHALMIC

## 2021-11-11 MED ORDER — MOXIFLOXACIN HCL 0.5 % OP SOLN
OPHTHALMIC | Status: DC | PRN
  Administered 2021-11-11: 0.2 mL via OPHTHALMIC

## 2021-11-11 SURGICAL SUPPLY — 13 items
CANNULA ANT/CHMB 27GA (MISCELLANEOUS) ×2 IMPLANT
DISSECTOR HYDRO NUCLEUS 50X22 (MISCELLANEOUS) ×2 IMPLANT
GLOVE SURG GAMMEX PI TX LF 7.5 (GLOVE) ×2 IMPLANT
GLOVE SURG SYN 8.5  E (GLOVE) ×2
GLOVE SURG SYN 8.5 E (GLOVE) ×1 IMPLANT
GOWN STRL REUS W/ TWL LRG LVL3 (GOWN DISPOSABLE) ×2 IMPLANT
GOWN STRL REUS W/TWL LRG LVL3 (GOWN DISPOSABLE) ×4
LENS IOL TECNIS EYHANCE 21.0 (Intraocular Lens) ×2 IMPLANT
PACK EYE AFTER SURG (MISCELLANEOUS) ×2 IMPLANT
SYR 3ML LL SCALE MARK (SYRINGE) ×2 IMPLANT
SYR TB 1ML LUER SLIP (SYRINGE) ×2 IMPLANT
WATER STERILE IRR 250ML POUR (IV SOLUTION) ×2 IMPLANT
WIPE NON LINTING 3.25X3.25 (MISCELLANEOUS) ×2 IMPLANT

## 2021-11-11 NOTE — Anesthesia Postprocedure Evaluation (Signed)
Anesthesia Post Note  Patient: Kristy Ford  Procedure(s) Performed: CATARACT EXTRACTION PHACO AND INTRAOCULAR LENS PLACEMENT (IOC) RIGHT (Right: Eye)     Patient location during evaluation: PACU Anesthesia Type: MAC Level of consciousness: awake Pain management: pain level controlled Vital Signs Assessment: post-procedure vital signs reviewed and stable Respiratory status: respiratory function stable Cardiovascular status: stable Postop Assessment: no apparent nausea or vomiting Anesthetic complications: no   No notable events documented.  Veda Canning

## 2021-11-11 NOTE — Anesthesia Procedure Notes (Signed)
Procedure Name: MAC Date/Time: 11/11/2021 10:24 AM Performed by: Cameron Ali, CRNA Pre-anesthesia Checklist: Patient identified, Emergency Drugs available, Suction available, Timeout performed and Patient being monitored Patient Re-evaluated:Patient Re-evaluated prior to induction Oxygen Delivery Method: Nasal cannula Placement Confirmation: positive ETCO2

## 2021-11-11 NOTE — H&P (Signed)
[facility]   Primary Care Physician:  Gladstone Lighter, MD Ophthalmologist: Dr. Benay Pillow  Pre-Procedure History & Physical: HPI:  Kristy Ford is a 65 y.o. female here for cataract surgery.   Past Medical History:  Diagnosis Date   Arthritis    lower back   Carotid artery stenosis    bilateral   Hx of atrial fibrillation without current medication 2014   Hyperlipidemia    Hypertension    Melanoma ([place]) 1991   L forearm    Osteoporosis    PONV (postoperative nausea and vomiting)     Past Surgical History:  Procedure Laterality Date   ABDOMINAL HYSTERECTOMY      Prior to Admission medications   Medication Sig Start Date End Date Taking? Authorizing Provider  acetaminophen (TYLENOL) 500 MG tablet Take 500 mg by mouth at bedtime.   Yes [provider]  Cholecalciferol (VITAMIN D3) 125 MCG (5000 UT) CAPS Take 10,000 Units by mouth daily.   Yes [provider]  DHA-EPA-Vitamin E (OMEGA-3 COMPLEX PO) Take 2 capsules by mouth daily. doTerra   Yes [provider]  MAGNESIUM PO Take by mouth daily.   Yes [provider]  Multiple Vitamin (MULTIVITAMIN) capsule Take 2 capsules by mouth daily. doTerra   Yes [provider]  Multiple Vitamins-Minerals (CELLULAR SECURITY PO) Take 2 capsules by mouth daily. doTerra   Yes [provider]  mupirocin ointment (BACTROBAN) 2 % Apply 1 application topically daily. With dressing change 08/07/20  Yes Ralene Bathe, MD  OVER THE COUNTER MEDICATION 2 capsules at bedtime. doTerra Restful Complex   Yes [provider]  telmisartan (MICARDIS) 40 MG tablet Take 40 mg by mouth at bedtime.   Yes [provider]    Allergies as of 10/01/2021 - Review Complete 03/20/2021  Allergen Reaction Noted   Penicillins Rash 02/23/2012    History reviewed. No pertinent family history.  Social History   Socioeconomic History   Marital status: Married    Spouse name:  Not on file   Number of children: Not on file   Years of education: Not on file   Highest education level: Not on file  Occupational History   Not on file  Tobacco Use   Smoking status: Never   Smokeless tobacco: Never  Vaping Use   Vaping Use: Never used  Substance and Sexual Activity   Alcohol use: Not Currently   Drug use: Not on file   Sexual activity: Not on file  Other Topics Concern   Not on file  Social History Narrative   Not on file   Social Determinants of Health   Financial Resource Strain: Not on file  Food Insecurity: Not on file  Transportation Needs: Not on file  Physical Activity: Not on file  Stress: Not on file  Social Connections: Not on file  Intimate Partner Violence: Not on file    Review of Systems: See HPI, otherwise negative ROS  Physical Exam: BP (!) 152/77   Pulse 77   Temp 99.7 F (37.6 C) (Temporal)   Resp 18   Ht 5\' 2"  (1.575 m)   Wt 71.9 kg   SpO2 97%   BMI 28.99 kg/m  General:   Alert, cooperative in NAD Head:  Normocephalic and atraumatic. Respiratory:  Normal work of breathing. Cardiovascular:  RRR  Impression/Plan: Kristy Ford is here for cataract surgery.  Risks, benefits, limitations, and alternatives regarding cataract surgery have been reviewed with the patient.  Questions have been answered.  All parties agreeable.   Benay Pillow, MD  11/11/2021, 10:14 AM

## 2021-11-11 NOTE — Op Note (Signed)
OPERATIVE NOTE  Kristy Ford [id number] 11/11/2021   PREOPERATIVE DIAGNOSIS:  Nuclear sclerotic cataract right eye.  H25.11   POSTOPERATIVE DIAGNOSIS:    Nuclear sclerotic cataract right eye.     PROCEDURE:  Phacoemusification with posterior chamber intraocular lens placement of the right eye   LENS:   Implant Name Type Inv. Item Serial No. Manufacturer Lot No. LRB No. Used Action  LENS IOL TECNIS EYHANCE 21.0 - [id number] Intraocular Lens LENS IOL TECNIS EYHANCE 21.0 [id number] JOHNSON   Right 1 Implanted       Procedure(s) with comments: CATARACT EXTRACTION PHACO AND INTRAOCULAR LENS PLACEMENT (IOC) RIGHT (Right) - 9.48 00:47.7  DIB00 +21.0   SURGEON:  Benay Pillow, MD, MPH  ANESTHESIOLOGIST: Anesthesiologist: Veda Canning, MD CRNA: Cameron Ali, CRNA   ANESTHESIA:  Topical with tetracaine drops augmented with 1% preservative-free intracameral lidocaine.  ESTIMATED BLOOD LOSS: less than 1 mL.   COMPLICATIONS:  None.   DESCRIPTION OF PROCEDURE:  The patient was identified in the holding room and transported to the operating room and placed in the supine position under the operating microscope.  The right eye was identified as the operative eye and it was prepped and draped in the usual sterile ophthalmic fashion.   A 1.0 millimeter clear-corneal paracentesis was made at the 10:30 position. 0.5 ml of preservative-free 1% lidocaine with epinephrine was injected into the anterior chamber.  The anterior chamber was filled with Healon 5 viscoelastic.  A 2.4 millimeter keratome was used to make a near-clear corneal incision at the 8:00 position.  A curvilinear capsulorrhexis was made with a cystotome and capsulorrhexis forceps.  Balanced salt solution was used to hydrodissect and hydrodelineate the nucleus.   Phacoemulsification was then used in stop and chop fashion to remove the lens nucleus and epinucleus.  The remaining cortex was then removed using the irrigation and  aspiration handpiece. Healon was then placed into the capsular bag to distend it for lens placement.  A lens was then injected into the capsular bag.  The remaining viscoelastic was aspirated.   Wounds were hydrated with balanced salt solution.  The anterior chamber was inflated to a physiologic pressure with balanced salt solution.   Intracameral vigamox 0.1 mL undiluted was injected into the eye and a drop placed onto the ocular surface.  No wound leaks were noted.  The patient was taken to the recovery room in stable condition without complications of anesthesia or surgery  Benay Pillow 11/11/2021, 10:41 AM

## 2021-11-11 NOTE — Anesthesia Preprocedure Evaluation (Signed)
Anesthesia Evaluation  Patient identified by MRN, date of birth, ID band Patient awake    Reviewed: Allergy & Precautions, NPO status   History of Anesthesia Complications (+) PONV  Airway Mallampati: II  TM Distance: >3 FB     Dental   Pulmonary    Pulmonary exam normal        Cardiovascular hypertension,  Rhythm:Regular Rate:Normal  HLD   Neuro/Psych    GI/Hepatic   Endo/Other    Renal/GU      Musculoskeletal  (+) Arthritis , Osteoporosis   Abdominal   Peds  Hematology   Anesthesia Other Findings   Reproductive/Obstetrics                            Anesthesia Physical Anesthesia Plan  ASA: 2  Anesthesia Plan: MAC   Post-op Pain Management:    Induction: Intravenous  PONV Risk Score and Plan: TIVA, Midazolam and Treatment may vary due to age or medical condition  Airway Management Planned: Natural Airway and Nasal Cannula  Additional Equipment:   Intra-op Plan:   Post-operative Plan:   Informed Consent: I have reviewed the patients History and Physical, chart, labs and discussed the procedure including the risks, benefits and alternatives for the proposed anesthesia with the patient or authorized representative who has indicated his/her understanding and acceptance.       Plan Discussed with: CRNA  Anesthesia Plan Comments:         Anesthesia Quick Evaluation

## 2021-11-11 NOTE — Transfer of Care (Signed)
Immediate Anesthesia Transfer of Care Note  Patient: Kristy Ford  Procedure(s) Performed: CATARACT EXTRACTION PHACO AND INTRAOCULAR LENS PLACEMENT (IOC) RIGHT (Right: Eye)  Patient Location: PACU  Anesthesia Type: MAC  Level of Consciousness: awake, alert  and patient cooperative  Airway and Oxygen Therapy: Patient Spontanous Breathing and Patient connected to supplemental oxygen  Post-op Assessment: Post-op Vital signs reviewed, Patient's Cardiovascular Status Stable, Respiratory Function Stable, Patent Airway and No signs of Nausea or vomiting  Post-op Vital Signs: Reviewed and stable  Complications: No notable events documented.

## 2021-11-12 ENCOUNTER — Encounter: Payer: Self-pay | Admitting: Ophthalmology

## 2021-11-19 NOTE — Discharge Instructions (Signed)

## 2021-11-25 ENCOUNTER — Ambulatory Visit: Payer: Medicare Other | Admitting: Anesthesiology

## 2021-11-25 ENCOUNTER — Encounter
Admission: RE | Disposition: A | Discharge status: Home / Self Care | Payer: Self-pay | Source: Home / Self Care | Attending: Ophthalmology

## 2021-11-25 ENCOUNTER — Ambulatory Visit
Admission: RE | Admit: 2021-11-25 | Discharge: 2021-11-25 | Disposition: A | Discharge status: Home / Self Care | Payer: Medicare Other | Attending: Ophthalmology | Admitting: Ophthalmology

## 2021-11-25 ENCOUNTER — Other Ambulatory Visit: Payer: Self-pay

## 2021-11-25 DIAGNOSIS — I4891 Unspecified atrial fibrillation: Secondary | ICD-10-CM | POA: Insufficient documentation

## 2021-11-25 DIAGNOSIS — I10 Essential (primary) hypertension: Secondary | ICD-10-CM | POA: Diagnosis not present

## 2021-11-25 DIAGNOSIS — M1990 Unspecified osteoarthritis, unspecified site: Secondary | ICD-10-CM | POA: Diagnosis not present

## 2021-11-25 DIAGNOSIS — H2512 Age-related nuclear cataract, left eye: Secondary | ICD-10-CM | POA: Insufficient documentation

## 2021-11-25 DIAGNOSIS — M810 Age-related osteoporosis without current pathological fracture: Secondary | ICD-10-CM | POA: Insufficient documentation

## 2021-11-25 DIAGNOSIS — Z9189 Other specified personal risk factors, not elsewhere classified: Secondary | ICD-10-CM | POA: Diagnosis not present

## 2021-11-25 HISTORY — PX: CATARACT EXTRACTION W/PHACO: SHX586

## 2021-11-25 SURGERY — PHACOEMULSIFICATION, CATARACT, WITH IOL INSERTION
Anesthesia: Monitor Anesthesia Care | Site: Eye | Laterality: Left

## 2021-11-25 MED ORDER — SIGHTPATH DOSE#1 SODIUM HYALURONATE 10 MG/ML IO SOLUTION
PREFILLED_SYRINGE | INTRAOCULAR | Status: DC | PRN
  Administered 2021-11-25: 0.85 mL via INTRAOCULAR

## 2021-11-25 MED ORDER — SIGHTPATH DOSE#1 SODIUM HYALURONATE 23 MG/ML IO SOLUTION
PREFILLED_SYRINGE | INTRAOCULAR | Status: DC | PRN
  Administered 2021-11-25: 0.6 mL via INTRAOCULAR

## 2021-11-25 MED ORDER — FENTANYL CITRATE (PF) 100 MCG/2ML IJ SOLN
INTRAMUSCULAR | Status: DC | PRN
  Administered 2021-11-25 (×2): 50 ug via INTRAVENOUS

## 2021-11-25 MED ORDER — ONDANSETRON HCL 4 MG/2ML IJ SOLN
INTRAMUSCULAR | Status: DC | PRN
  Administered 2021-11-25: 4 mg via INTRAVENOUS

## 2021-11-25 MED ORDER — MOXIFLOXACIN HCL 0.5 % OP SOLN
OPHTHALMIC | Status: DC | PRN
  Administered 2021-11-25: 0.2 mL via OPHTHALMIC

## 2021-11-25 MED ORDER — TETRACAINE HCL 0.5 % OP SOLN
1.0000 [drp] | OPHTHALMIC | Status: DC | PRN
  Administered 2021-11-25 (×3): 1 [drp] via OPHTHALMIC

## 2021-11-25 MED ORDER — MIDAZOLAM HCL 2 MG/2ML IJ SOLN
INTRAMUSCULAR | Status: DC | PRN
  Administered 2021-11-25: 2 mg via INTRAVENOUS

## 2021-11-25 MED ORDER — SIGHTPATH DOSE#1 BSS IO SOLN
INTRAOCULAR | Status: DC | PRN
  Administered 2021-11-25: 57 mL via OPHTHALMIC

## 2021-11-25 MED ORDER — SIGHTPATH DOSE#1 BSS IO SOLN
INTRAOCULAR | Status: DC | PRN
  Administered 2021-11-25: 15 mL

## 2021-11-25 MED ORDER — LIDOCAINE HCL (PF) 2 % IJ SOLN
INTRAOCULAR | Status: DC | PRN
  Administered 2021-11-25: 1 mL via INTRAOCULAR

## 2021-11-25 MED ORDER — LACTATED RINGERS IV SOLN: INTRAVENOUS | Status: DC

## 2021-11-25 MED ORDER — ARMC OPHTHALMIC DILATING DROPS
1.0000 "application " | OPHTHALMIC | Status: AC | PRN
  Administered 2021-11-25 (×3): 1 via OPHTHALMIC

## 2021-11-25 SURGICAL SUPPLY — 25 items
CANNULA ANT/CHMB 27G (MISCELLANEOUS) ×1 IMPLANT
CANNULA ANT/CHMB 27GA (MISCELLANEOUS) ×2 IMPLANT
DISSECTOR HYDRO NUCLEUS 50X22 (MISCELLANEOUS) ×2 IMPLANT
GLOVE SURG GAMMEX PI TX LF 7.5 (GLOVE) ×2 IMPLANT
GLOVE SURG SYN 8.5  E (GLOVE) ×1
GLOVE SURG SYN 8.5 E (GLOVE) ×1 IMPLANT
GLOVE SURG SYN 8.5 PF PI (GLOVE) ×1 IMPLANT
GOWN STRL REUS W/ TWL LRG LVL3 (GOWN DISPOSABLE) ×2 IMPLANT
GOWN STRL REUS W/TWL LRG LVL3 (GOWN DISPOSABLE) ×4
LENS IOL TECNIS EYHANCE 21.0 (Intraocular Lens) ×1 IMPLANT
MARKER SKIN DUAL TIP RULER LAB (MISCELLANEOUS) ×2 IMPLANT
NDL FILTER BLUNT 18X1 1/2 (NEEDLE) ×1 IMPLANT
NEEDLE FILTER BLUNT 18X 1/2SAF (NEEDLE) ×1
NEEDLE FILTER BLUNT 18X1 1/2 (NEEDLE) ×1 IMPLANT
PACK EYE AFTER SURG (MISCELLANEOUS) ×2 IMPLANT
PACK VIT ANT 23G (MISCELLANEOUS) IMPLANT
RING MALYGIN (MISCELLANEOUS) IMPLANT
SUT ETHILON 10-0 CS-B-6CS-B-6 (SUTURE)
SUTURE EHLN 10-0 CS-B-6CS-B-6 (SUTURE) IMPLANT
SYR 3ML LL SCALE MARK (SYRINGE) ×2 IMPLANT
SYR 5ML LL (SYRINGE) ×2 IMPLANT
SYR TB 1ML LUER SLIP (SYRINGE) ×2 IMPLANT
TIP IRRIGATON/ASPIRATION (MISCELLANEOUS) ×1 IMPLANT
WATER STERILE IRR 250ML POUR (IV SOLUTION) ×2 IMPLANT
WIPE NON LINTING 3.25X3.25 (MISCELLANEOUS) ×2 IMPLANT

## 2021-11-25 NOTE — Transfer of Care (Signed)
Immediate Anesthesia Transfer of Care Note  Patient: Kristy Ford  Procedure(s) Performed: CATARACT EXTRACTION PHACO AND INTRAOCULAR LENS PLACEMENT (IOC) LEFT (Left: Eye)  Patient Location: PACU  Anesthesia Type: MAC  Level of Consciousness: awake, alert  and patient cooperative  Airway and Oxygen Therapy: Patient Spontanous Breathing and Patient connected to supplemental oxygen  Post-op Assessment: Post-op Vital signs reviewed, Patient's Cardiovascular Status Stable, Respiratory Function Stable, Patent Airway and No signs of Nausea or vomiting  Post-op Vital Signs: Reviewed and stable  Complications: No notable events documented.

## 2021-11-25 NOTE — Anesthesia Postprocedure Evaluation (Signed)
Anesthesia Post Note  Patient: Kristy Ford  Procedure(s) Performed: CATARACT EXTRACTION PHACO AND INTRAOCULAR LENS PLACEMENT (IOC) LEFT (Left: Eye)     Patient location during evaluation: PACU Anesthesia Type: MAC Level of consciousness: awake and alert Pain management: pain level controlled Vital Signs Assessment: post-procedure vital signs reviewed and stable Respiratory status: spontaneous breathing, nonlabored ventilation and respiratory function stable Cardiovascular status: stable and blood pressure returned to baseline Postop Assessment: no apparent nausea or vomiting Anesthetic complications: no   No notable events documented.  April Manson

## 2021-11-25 NOTE — Anesthesia Preprocedure Evaluation (Signed)
Anesthesia Evaluation  Patient identified by MRN, date of birth, ID band Patient awake    Reviewed: Allergy & Precautions, NPO status   History of Anesthesia Complications (+) PONV and history of anesthetic complications (PONV)  Airway Mallampati: II  TM Distance: >3 FB     Dental   Pulmonary    Pulmonary exam normal        Cardiovascular hypertension, + dysrhythmias (h/o afib) Atrial Fibrillation  Rhythm:Regular Rate:Normal  HLD   Neuro/Psych    GI/Hepatic   Endo/Other    Renal/GU      Musculoskeletal  (+) Arthritis , Osteoporosis   Abdominal   Peds  Hematology   Anesthesia Other Findings   Reproductive/Obstetrics                             Anesthesia Physical  Anesthesia Plan  ASA: 3  Anesthesia Plan: MAC   Post-op Pain Management:    Induction: Intravenous  PONV Risk Score and Plan: 3 and TIVA, Midazolam and Treatment may vary due to age or medical condition  Airway Management Planned: Natural Airway and Nasal Cannula  Additional Equipment:   Intra-op Plan:   Post-operative Plan:   Informed Consent: I have reviewed the patients History and Physical, chart, labs and discussed the procedure including the risks, benefits and alternatives for the proposed anesthesia with the patient or authorized representative who has indicated his/her understanding and acceptance.       Plan Discussed with: CRNA  Anesthesia Plan Comments:         Anesthesia Quick Evaluation

## 2021-11-25 NOTE — Op Note (Signed)
OPERATIVE NOTE  [name] [id number] 11/25/2021   PREOPERATIVE DIAGNOSIS:  Nuclear sclerotic cataract left eye.  H25.12   POSTOPERATIVE DIAGNOSIS:    Nuclear sclerotic cataract left eye.     PROCEDURE:  Phacoemusification with posterior chamber intraocular lens placement of the left eye   LENS:   Implant Name Type Inv. Item Serial No. Manufacturer Lot No. LRB No. Used Action  LENS IOL TECNIS EYHANCE 21.0 - [id number] Intraocular Lens LENS IOL TECNIS EYHANCE 21.0 [id number] JOHNSON   Left 1 Implanted      Procedure(s) with comments: CATARACT EXTRACTION PHACO AND INTRAOCULAR LENS PLACEMENT (IOC) LEFT (Left) - 2.58 0:21.6  DIB00 +21.0   SURGEON:  Benay Pillow, MD, MPH   ANESTHESIA:  Topical with tetracaine drops augmented with 1% preservative-free intracameral lidocaine.  ESTIMATED BLOOD LOSS: <1 mL   COMPLICATIONS:  None.   DESCRIPTION OF PROCEDURE:  The patient was identified in the holding room and transported to the operating room and placed in the supine position under the operating microscope.  The left eye was identified as the operative eye and it was prepped and draped in the usual sterile ophthalmic fashion.   A 1.0 millimeter clear-corneal paracentesis was made at the 5:00 position. 0.5 ml of preservative-free 1% lidocaine with epinephrine was injected into the anterior chamber.  The anterior chamber was filled with Healon 5 viscoelastic.  A 2.4 millimeter keratome was used to make a near-clear corneal incision at the 2:00 position.  A curvilinear capsulorrhexis was made with a cystotome and capsulorrhexis forceps.  Balanced salt solution was used to hydrodissect and hydrodelineate the nucleus.   Phacoemulsification was then used in stop and chop fashion to remove the lens nucleus and epinucleus.  The remaining cortex was then removed using the irrigation and aspiration handpiece. Healon was then placed into the capsular bag to distend it for lens placement.  A lens  was then injected into the capsular bag.  The remaining viscoelastic was aspirated.   Wounds were hydrated with balanced salt solution.  The anterior chamber was inflated to a physiologic pressure with balanced salt solution.  Intracameral vigamox 0.1 mL undiltued was injected into the eye and a drop placed onto the ocular surface.  No wound leaks were noted.  The patient was taken to the recovery room in stable condition without complications of anesthesia or surgery  Benay Pillow 11/25/2021, 9:37 AM

## 2021-11-25 NOTE — H&P (Signed)
[facility]   Primary Care Physician:  Gladstone Lighter, MD Ophthalmologist: Dr. Benay Pillow  Pre-Procedure History & Physical: HPI:  Kristy Ford is a 65 y.o. female here for cataract surgery.   Past Medical History:  Diagnosis Date   Arthritis    lower back   Carotid artery stenosis    bilateral   Hx of atrial fibrillation without current medication 2014   Hyperlipidemia    Hypertension    Melanoma ([place]) 1991   L forearm    Osteoporosis    PONV (postoperative nausea and vomiting)     Past Surgical History:  Procedure Laterality Date   ABDOMINAL HYSTERECTOMY     CATARACT EXTRACTION W/PHACO Right 11/11/2021   Procedure: CATARACT EXTRACTION PHACO AND INTRAOCULAR LENS PLACEMENT ([place]) RIGHT;  Surgeon: Eulogio Bear, MD;  Location: [place];  Service: Ophthalmology;  Laterality: Right;  9.48 00:47.7    Prior to Admission medications   Medication Sig Start Date End Date Taking? Authorizing Provider  acetaminophen (TYLENOL) 500 MG tablet Take 500 mg by mouth at bedtime.   Yes [provider]  Cholecalciferol (VITAMIN D3) 125 MCG (5000 UT) CAPS Take 10,000 Units by mouth daily.   Yes [provider]  DHA-EPA-Vitamin E (OMEGA-3 COMPLEX PO) Take 2 capsules by mouth daily. doTerra   Yes [provider]  MAGNESIUM PO Take by mouth daily.   Yes [provider]  Multiple Vitamin (MULTIVITAMIN) capsule Take 2 capsules by mouth daily. doTerra   Yes [provider]  Multiple Vitamins-Minerals (CELLULAR SECURITY PO) Take 2 capsules by mouth daily. doTerra   Yes [provider]  mupirocin ointment (BACTROBAN) 2 % Apply 1 application topically daily. With dressing change 08/07/20  Yes Ralene Bathe, MD  OVER THE COUNTER MEDICATION 2 capsules at bedtime. doTerra Restful Complex   Yes [provider]  telmisartan (MICARDIS) 40 MG tablet Take 40 mg by mouth at bedtime.   Yes [provider]     Allergies as of 10/01/2021 - Review Complete 03/20/2021  Allergen Reaction Noted   Penicillins Rash 02/23/2012    No family history on file.  Social History   Socioeconomic History   Marital status: Married    Spouse name: Not on file   Number of children: Not on file   Years of education: Not on file   Highest education level: Not on file  Occupational History   Not on file  Tobacco Use   Smoking status: Never   Smokeless tobacco: Never  Vaping Use   Vaping Use: Never used  Substance and Sexual Activity   Alcohol use: Not Currently   Drug use: Not on file   Sexual activity: Not on file  Other Topics Concern   Not on file  Social History Narrative   Not on file   Social Determinants of Health   Financial Resource Strain: Not on file  Food Insecurity: Not on file  Transportation Needs: Not on file  Physical Activity: Not on file  Stress: Not on file  Social Connections: Not on file  Intimate Partner Violence: Not on file    Review of Systems: See HPI, otherwise negative ROS  Physical Exam: BP (!) 172/89   Pulse 80   Temp 97.7 F (36.5 C) (Temporal)   Resp 16   Ht 5\' 2"  (1.575 m)   Wt 71.7 kg   SpO2 100%   BMI 28.90 kg/m  General:   Alert, cooperative in NAD Head:  Normocephalic and atraumatic. Respiratory:  Normal work of breathing. Cardiovascular:  RRR  Impression/Plan: [name] is here for cataract surgery.  Risks, benefits, limitations, and alternatives regarding cataract surgery have been reviewed with the patient.  Questions have been answered.  All parties agreeable.   Benay Pillow, MD  11/25/2021, 9:07 AM

## 2021-11-26 ENCOUNTER — Encounter: Payer: Self-pay | Admitting: Ophthalmology

## 2022-01-13 ENCOUNTER — Other Ambulatory Visit: Payer: Self-pay | Admitting: Internal Medicine

## 2022-01-13 ENCOUNTER — Ambulatory Visit
Admission: RE | Admit: 2022-01-13 | Discharge: 2022-01-13 | Disposition: A | Discharge status: Home / Self Care | Payer: Medicare Other | Source: Ambulatory Visit | Attending: Internal Medicine | Admitting: Internal Medicine

## 2022-01-13 ENCOUNTER — Other Ambulatory Visit: Payer: Self-pay

## 2022-01-13 DIAGNOSIS — Z1231 Encounter for screening mammogram for malignant neoplasm of breast: Secondary | ICD-10-CM | POA: Diagnosis present

## 2022-03-26 ENCOUNTER — Encounter: Payer: Medicare Other | Admitting: Dermatology

## 2022-07-30 ENCOUNTER — Encounter: Payer: Self-pay | Admitting: Dermatology

## 2022-07-30 ENCOUNTER — Ambulatory Visit: Payer: Medicare Other | Admitting: Dermatology

## 2022-07-30 DIAGNOSIS — L821 Other seborrheic keratosis: Secondary | ICD-10-CM

## 2022-07-30 DIAGNOSIS — L820 Inflamed seborrheic keratosis: Secondary | ICD-10-CM | POA: Diagnosis not present

## 2022-07-30 DIAGNOSIS — L814 Other melanin hyperpigmentation: Secondary | ICD-10-CM

## 2022-07-30 DIAGNOSIS — Z1283 Encounter for screening for malignant neoplasm of skin: Secondary | ICD-10-CM

## 2022-07-30 DIAGNOSIS — L578 Other skin changes due to chronic exposure to nonionizing radiation: Secondary | ICD-10-CM

## 2022-07-30 DIAGNOSIS — L905 Scar conditions and fibrosis of skin: Secondary | ICD-10-CM | POA: Diagnosis not present

## 2022-07-30 DIAGNOSIS — D1800 Hemangioma unspecified site: Secondary | ICD-10-CM

## 2022-07-30 DIAGNOSIS — D229 Melanocytic nevi, unspecified: Secondary | ICD-10-CM

## 2022-07-30 DIAGNOSIS — Z85820 Personal history of malignant melanoma of skin: Secondary | ICD-10-CM

## 2022-07-30 NOTE — Progress Notes (Signed)
Follow-Up Visit   Subjective  Kristy Ford is a 66 y.o. female who presents for the following: Total body skin exam (Hx of Melanoma L forearm, hx of AKs) and check spot (L eyebrow, >1year, growing, itchy). The patient presents for Total-Body Skin Exam (TBSE) for skin cancer screening and mole check.  The patient has spots, moles and lesions to be evaluated, some may be new or changing and the patient has concerns that these could be cancer.   The following portions of the chart were reviewed this encounter and updated as appropriate:   Tobacco  Allergies  Meds  Problems  Med Hx  Surg Hx  Fam Hx     Review of Systems:  No other skin or systemic complaints except as noted in HPI or Assessment and Plan.  Objective  Well appearing patient in no apparent distress; mood and affect are within normal limits.  A full examination was performed including scalp, head, eyes, ears, nose, lips, neck, chest, axillae, abdomen, back, buttocks, bilateral upper extremities, bilateral lower extremities, hands, feet, fingers, toes, fingernails, and toenails. All findings within normal limits unless otherwise noted below.  L forearm Well healed scar with no evidence of recurrence, no lymphadenopathy.   left mid back 4.0cm lat to spine scar  L mid brow x 1 Stuck on waxy paps with erythema   Assessment & Plan   Lentigines - Scattered tan macules - Due to sun exposure - Benign-appearing, observe - Recommend daily broad spectrum sunscreen SPF 30+ to sun-exposed areas, reapply every 2 hours as needed. - Call for any changes  Seborrheic Keratoses - Stuck-on, waxy, tan-brown papules and/or plaques  - Benign-appearing - Discussed benign etiology and prognosis. - Observe - Call for any changes  Melanocytic Nevi - Tan-brown and/or pink-flesh-colored symmetric macules and papules - Benign appearing on exam today - Observation - Call clinic for new or changing moles - Recommend daily use of  broad spectrum spf 30+ sunscreen to sun-exposed areas.   Hemangiomas - Red papules - Discussed benign nature - Observe - Call for any changes  Actinic Damage - Chronic condition, secondary to cumulative UV/sun exposure - diffuse scaly erythematous macules with underlying dyspigmentation - Recommend daily broad spectrum sunscreen SPF 30+ to sun-exposed areas, reapply every 2 hours as needed.  - Staying in the shade or wearing long sleeves, sun glasses (UVA+UVB protection) and wide brim hats (4-inch brim around the entire circumference of the hat) are also recommended for sun protection.  - Call for new or changing lesions.  Skin cancer screening performed today.   History of melanoma L forearm 1991 Clear.  No lymphadenopathy.  Observe for recurrence. Call clinic for new or changing lesions.  Recommend regular skin exams, daily broad-spectrum spf 30+ sunscreen use, and photoprotection.    Scar left mid back 4.0cm lat to spine With Pruritis Recommend Serica scar gel or Mederma scar gel  Inflamed seborrheic keratosis L mid brow x 1 Symptomatic, irritating, patient would like treated. Destruction of lesion - L mid brow x 1 Complexity: simple   Destruction method: cryotherapy   Informed consent: discussed and consent obtained   Timeout:  patient name, date of birth, surgical site, and procedure verified Lesion destroyed using liquid nitrogen: Yes   Region frozen until ice ball extended beyond lesion: Yes   Outcome: patient tolerated procedure well with no complications   Post-procedure details: wound care instructions given    Return in about 1 year (around 07/31/2023) for TBSE, Hx of  Melanoma, Hx of AKs.  I, Othelia Pulling, RMA, am acting as scribe for Sarina Ser, MD . Documentation: I have reviewed the above documentation for accuracy and completeness, and I agree with the above.  Sarina Ser, MD

## 2022-07-30 NOTE — Patient Instructions (Addendum)
For scar on back that itches Recommend Serica scar gel or Mederma scar gel  Cryotherapy Aftercare  Wash gently with soap and water everyday.   Apply Vaseline and Band-Aid daily until healed.   Due to recent changes in healthcare laws, you may see results of your pathology and/or laboratory studies on MyChart before the doctors have had a chance to review them. We understand that in some cases there may be results that are confusing or concerning to you. Please understand that not all results are received at the same time and often the doctors may need to interpret multiple results in order to provide you with the best plan of care or course of treatment. Therefore, we ask that you please give [place] 2 business days to thoroughly review all your results before contacting the office for clarification. Should we see a critical lab result, you will be contacted sooner.   If You Need Anything After Your Visit  If you have any questions or concerns for your doctor, please call our main line at [phone] and press option 4 to reach your doctor's medical assistant. If no one answers, please leave a voicemail as directed and we will return your call as soon as possible. Messages left after 4 pm will be answered the following business day.   You may also send [place] a message via [place]. We typically respond to MyChart messages within 1-2 business days.  For prescription refills, please ask your pharmacy to contact our office. Our fax number is [phone].  If you have an urgent issue when the clinic is closed that cannot wait until the next business day, you can page your doctor at the number below.    Please note that while we do our best to be available for urgent issues outside of office hours, we are not available 24/7.   If you have an urgent issue and are unable to reach [place], you may choose to seek medical care at your doctor's office, retail clinic, urgent care center, or emergency room.  If you  have a medical emergency, please immediately call 911 or go to the emergency department.  Pager Numbers  - Dr. Nehemiah Massed: [phone]  - Dr. Laurence Ferrari: [phone]  - Dr. Nicole Kindred: [phone]  In the event of inclement weather, please call our main line at [phone] for an update on the status of any delays or closures.  Dermatology Medication Tips: Please keep the boxes that topical medications come in in order to help keep track of the instructions about where and how to use these. Pharmacies typically print the medication instructions only on the boxes and not directly on the medication tubes.   If your medication is too expensive, please contact our office at [phone] option 4 or send [place] a message through [place].   We are unable to tell what your co-pay for medications will be in advance as this is different depending on your insurance coverage. However, we may be able to find a substitute medication at lower cost or fill out paperwork to get insurance to cover a needed medication.   If a prior authorization is required to get your medication covered by your insurance company, please allow [place] 1-2 business days to complete this process.  Drug prices often vary depending on where the prescription is filled and some pharmacies may offer cheaper prices.  The website www.goodrx.com contains coupons for medications through different pharmacies. The prices here do not account for what the cost may be with help  from insurance (it may be cheaper with your insurance), but the website can give you the price if you did not use any insurance.  - You can print the associated coupon and take it with your prescription to the pharmacy.  - You may also stop by our office during regular business hours and pick up a GoodRx coupon card.  - If you need your prescription sent electronically to a different pharmacy, notify our office through [facility] or by phone at [phone] option  4.     Si Usted Necesita Algo Despus de Su Visita  Tambin puede enviarnos un mensaje a travs de Pharmacist, community. Por lo general respondemos a los mensajes de MyChart en el transcurso de 1 a 2 das hbiles.  Para renovar recetas, por favor pida a su farmacia que se ponga en contacto con nuestra oficina. Harland Dingwall de fax es [place] [phone].  Si tiene un asunto urgente cuando la clnica est cerrada y que no puede esperar hasta el siguiente da hbil, puede llamar/localizar a su doctor(a) al nmero que aparece a continuacin.   Por favor, tenga en cuenta que aunque hacemos todo lo posible para estar disponibles para asuntos urgentes fuera del horario de [place], no estamos disponibles las 24 horas del da, los 7 das de la [place].   Si tiene un problema urgente y no puede comunicarse con nosotros, puede optar por buscar atencin mdica  en el consultorio de su doctor(a), en una clnica privada, en un centro de atencin urgente o en una sala de emergencias.  Si tiene Engineering geologist, por favor llame inmediatamente al 911 o vaya a la sala de emergencias.  Nmeros de bper  - Dr. Nehemiah Massed: [phone]  - Dra. Moye: [phone]  - Dra. Nicole Kindred: [phone]  En caso de inclemencias del [place], por favor llame a Johnsie Kindred principal al [phone] para una actualizacin sobre el [place] de cualquier retraso o cierre.  Consejos para la medicacin en dermatologa: Por favor, guarde las cajas en las que vienen los medicamentos de uso tpico para ayudarle a seguir las instrucciones sobre dnde y cmo usarlos. Las farmacias generalmente imprimen las instrucciones del medicamento slo en las cajas y no directamente en los tubos del [place].   Si su medicamento es muy caro, por favor, pngase en contacto con Zigmund Daniel llamando al [phone] y presione la opcin 4 o envenos un mensaje a travs de Pharmacist, community.   No podemos decirle cul ser su copago por los medicamentos por  adelantado ya que esto es diferente dependiendo de la cobertura de su seguro. Sin embargo, es posible que podamos encontrar un medicamento sustituto a Electrical engineer un formulario para que el seguro cubra el medicamento que se considera necesario.   Si se requiere una autorizacin previa para que su compaa de seguros [place] su medicamento, por favor permtanos de 1 a 2 das hbiles para completar este proceso.  Los precios de los medicamentos varan con frecuencia dependiendo del Environmental consultant de dnde se surte la receta y alguna farmacias pueden ofrecer precios ms baratos.  El sitio web www.goodrx.com tiene cupones para medicamentos de Airline pilot. Los precios aqu no tienen en cuenta lo que podra costar con la ayuda del seguro (puede ser ms barato con su seguro), pero el sitio web puede darle el precio si no utiliz Research scientist (physical sciences).  - Puede imprimir el cupn correspondiente y llevarlo con su receta a la farmacia.  - Tambin puede pasar por nuestra oficina durante el horario  de atencin regular y Charity fundraiser una tarjeta de cupones de GoodRx.  - Si necesita que su receta se enve electrnicamente a una farmacia diferente, informe a nuestra oficina a travs de MyChart de  o por telfono llamando al [phone] y presione la opcin 4.

## 2022-08-05 ENCOUNTER — Encounter: Payer: Self-pay | Admitting: Dermatology

## 2023-03-26 ENCOUNTER — Other Ambulatory Visit: Payer: Self-pay | Admitting: Internal Medicine

## 2023-03-26 DIAGNOSIS — Z1231 Encounter for screening mammogram for malignant neoplasm of breast: Secondary | ICD-10-CM

## 2023-04-07 ENCOUNTER — Ambulatory Visit
Admission: RE | Admit: 2023-04-07 | Discharge: 2023-04-07 | Disposition: A | Discharge status: Home / Self Care | Payer: Medicare Other | Source: Ambulatory Visit | Attending: Internal Medicine | Admitting: Internal Medicine

## 2023-04-07 DIAGNOSIS — Z1231 Encounter for screening mammogram for malignant neoplasm of breast: Secondary | ICD-10-CM | POA: Diagnosis present

## 2023-04-15 ENCOUNTER — Ambulatory Visit: Payer: Medicare Other | Admitting: Dermatology

## 2023-04-15 VITALS — BP 147/86

## 2023-04-15 DIAGNOSIS — Z79899 Other long term (current) drug therapy: Secondary | ICD-10-CM

## 2023-04-15 DIAGNOSIS — L259 Unspecified contact dermatitis, unspecified cause: Secondary | ICD-10-CM

## 2023-04-15 DIAGNOSIS — Z7189 Other specified counseling: Secondary | ICD-10-CM

## 2023-04-15 DIAGNOSIS — L821 Other seborrheic keratosis: Secondary | ICD-10-CM

## 2023-04-15 DIAGNOSIS — R21 Rash and other nonspecific skin eruption: Secondary | ICD-10-CM | POA: Diagnosis not present

## 2023-04-15 MED ORDER — MOMETASONE FUROATE 0.1 % EX CREA: 1.0000 | TOPICAL_CREAM | CUTANEOUS | 1 refills | Status: AC

## 2023-04-15 NOTE — Progress Notes (Signed)
   Follow Up Visit   Subjective  Kristy Ford is a 67 y.o. female who presents for the following: Rash face, ~5wks, itchy, tried Benadryl, HC 1% cr, Took 1 Zyrtec, no new facial products, has been using essential oils and face creams that she has used for 10 yrs  The following portions of the chart were reviewed this encounter and updated as appropriate: medications, allergies, medical history  Review of Systems:  No other skin or systemic complaints except as noted in HPI or Assessment and Plan.  Objective  Well appearing patient in no apparent distress; mood and affect are within normal limits.  A focused examination was performed of the following areas: face  Relevant exam findings are noted in the Assessment and Plan.   Assessment & Plan    RASH Contact vs Irritant Dermatitis Exam: face with background pinkness   Treatment Plan: Start Mometasone cr bid for 2 weeks to face then can d/c.   If rash not clear can decrease to qd 5d/wk until clear Recommend True Patch Test 36  Topical steroids (such as triamcinolone, fluocinolone, fluocinonide, mometasone, clobetasol, halobetasol, betamethasone, hydrocortisone) can cause thinning and lightening of the skin if they are used for too long in the same area. Your physician has selected the right strength medicine for your problem and area affected on the body. Please use your medication only as directed by your physician to prevent side effects.    SEBORRHEIC KERATOSIS - Stuck-on, waxy, tan-brown papules and/or plaques  - Benign-appearing - Discussed benign etiology and prognosis. - Observe - Call for any changes - face  Return in about 6 days (around 04/21/2023) for with Nurse for True patch test.  I, Ardis Rowan, RMA, am acting as scribe for Armida Sans, MD .  Documentation: I have reviewed the above documentation for accuracy and completeness, and I agree with the above.  Armida Sans, MD

## 2023-04-15 NOTE — Patient Instructions (Signed)
Due to recent changes in healthcare laws, you may see results of your pathology and/or laboratory studies on MyChart before the doctors have had a chance to review them. We understand that in some cases there may be results that are confusing or concerning to you. Please understand that not all results are received at the same time and often the doctors may need to interpret multiple results in order to provide you with the best plan of care or course of treatment. Therefore, we ask that you please give us 2 business days to thoroughly review all your results before contacting the office for clarification. Should we see a critical lab result, you will be contacted sooner.   If You Need Anything After Your Visit  If you have any questions or concerns for your doctor, please call our main line at [phone] and press option 4 to reach your doctor's medical assistant. If no one answers, please leave a voicemail as directed and we will return your call as soon as possible. Messages left after 4 pm will be answered the following business day.   You may also send us a message via MyChart. We typically respond to MyChart messages within 1-2 business days.  For prescription refills, please ask your pharmacy to contact our office. Our fax number is [phone].  If you have an urgent issue when the clinic is closed that cannot wait until the next business day, you can page your doctor at the number below.    Please note that while we do our best to be available for urgent issues outside of office hours, we are not available 24/7.   If you have an urgent issue and are unable to reach us, you may choose to seek medical care at your doctor's office, retail clinic, urgent care center, or emergency room.  If you have a medical emergency, please immediately call 911 or go to the emergency department.  Pager Numbers  - Dr. Kowalski: [phone]  - Dr. Moye: [phone]  - Dr. Stewart:  [phone]  In the event of inclement weather, please call our main line at [phone] for an update on the status of any delays or closures.  Dermatology Medication Tips: Please keep the boxes that topical medications come in in order to help keep track of the instructions about where and how to use these. Pharmacies typically print the medication instructions only on the boxes and not directly on the medication tubes.   If your medication is too expensive, please contact our office at [phone] option 4 or send us a message through MyChart.   We are unable to tell what your co-pay for medications will be in advance as this is different depending on your insurance coverage. However, we may be able to find a substitute medication at lower cost or fill out paperwork to get insurance to cover a needed medication.   If a prior authorization is required to get your medication covered by your insurance company, please allow us 1-2 business days to complete this process.  Drug prices often vary depending on where the prescription is filled and some pharmacies may offer cheaper prices.  The website www.goodrx.com contains coupons for medications through different pharmacies. The prices here do not account for what the cost may be with help from insurance (it may be cheaper with your insurance), but the website can give you the price if you did not use any insurance.  - You can print the associated coupon and take it with   your prescription to the pharmacy.  - You may also stop by our office during regular business hours and pick up a GoodRx coupon card.  - If you need your prescription sent electronically to a different pharmacy, notify our office through [place] MyChart or by phone at [phone] option 4.     Si Usted Necesita Algo Despus de Su Visita  Tambin puede enviarnos un mensaje a travs de MyChart. Por lo general respondemos a los mensajes de MyChart en el transcurso de 1 a 2  das hbiles.  Para renovar recetas, por favor pida a su farmacia que se ponga en contacto con nuestra oficina. Nuestro nmero de fax es el [phone].  Si tiene un asunto urgente cuando la clnica est cerrada y que no puede esperar hasta el siguiente da hbil, puede llamar/localizar a su doctor(a) al nmero que aparece a continuacin.   Por favor, tenga en cuenta que aunque hacemos todo lo posible para estar disponibles para asuntos urgentes fuera del horario de oficina, no estamos disponibles las 24 horas del da, los 7 das de la semana.   Si tiene un problema urgente y no puede comunicarse con nosotros, puede optar por buscar atencin mdica  en el consultorio de su doctor(a), en una clnica privada, en un centro de atencin urgente o en una sala de emergencias.  Si tiene una emergencia mdica, por favor llame inmediatamente al 911 o vaya a la sala de emergencias.  Nmeros de bper  - Dr. Kowalski: [phone]  - Dra. Moye: [phone]  - Dra. Stewart: [phone]  En caso de inclemencias del tiempo, por favor llame a nuestra lnea principal al [phone] para una actualizacin sobre el estado de cualquier retraso o cierre.  Consejos para la medicacin en dermatologa: Por favor, guarde las cajas en las que vienen los medicamentos de uso tpico para ayudarle a seguir las instrucciones sobre dnde y cmo usarlos. Las farmacias generalmente imprimen las instrucciones del medicamento slo en las cajas y no directamente en los tubos del medicamento.   Si su medicamento es muy caro, por favor, pngase en contacto con nuestra oficina llamando al [phone] y presione la opcin 4 o envenos un mensaje a travs de MyChart.   No podemos decirle cul ser su copago por los medicamentos por adelantado ya que esto es diferente dependiendo de la cobertura de su seguro. Sin embargo, es posible que podamos encontrar un medicamento sustituto a menor costo o llenar un formulario para que el  seguro cubra el medicamento que se considera necesario.   Si se requiere una autorizacin previa para que su compaa de seguros cubra su medicamento, por favor permtanos de 1 a 2 das hbiles para completar este proceso.  Los precios de los medicamentos varan con frecuencia dependiendo del lugar de dnde se surte la receta y alguna farmacias pueden ofrecer precios ms baratos.  El sitio web www.goodrx.com tiene cupones para medicamentos de diferentes farmacias. Los precios aqu no tienen en cuenta lo que podra costar con la ayuda del seguro (puede ser ms barato con su seguro), pero el sitio web puede darle el precio si no utiliz ningn seguro.  - Puede imprimir el cupn correspondiente y llevarlo con su receta a la farmacia.  - Tambin puede pasar por nuestra oficina durante el horario de atencin regular y recoger una tarjeta de cupones de GoodRx.  - Si necesita que su receta se enve electrnicamente a una farmacia diferente, informe a nuestra oficina a travs de MyChart de [place]   o por telfono llamando al [phone] y presione la opcin 4.  

## 2023-04-21 ENCOUNTER — Ambulatory Visit: Payer: Medicare Other

## 2023-04-21 ENCOUNTER — Encounter: Payer: Self-pay | Admitting: Dermatology

## 2023-04-23 ENCOUNTER — Ambulatory Visit: Payer: Medicare Other

## 2023-04-28 ENCOUNTER — Ambulatory Visit: Payer: Medicare Other | Admitting: Dermatology

## 2023-06-08 ENCOUNTER — Ambulatory Visit: Payer: Medicare Other | Admitting: Internal Medicine

## 2023-06-20 ENCOUNTER — Ambulatory Visit
Admission: EM | Admit: 2023-06-20 | Discharge: 2023-06-20 | Disposition: A | Discharge status: Home / Self Care | Payer: Medicare Other | Source: Home / Self Care

## 2023-06-20 ENCOUNTER — Ambulatory Visit (INDEPENDENT_AMBULATORY_CARE_PROVIDER_SITE_OTHER): Payer: Medicare Other

## 2023-06-20 DIAGNOSIS — S2232XA Fracture of one rib, left side, initial encounter for closed fracture: Secondary | ICD-10-CM | POA: Diagnosis not present

## 2023-06-20 MED ORDER — NAPROXEN 500 MG PO TABS: ORAL_TABLET | ORAL | 0 refills | Status: AC

## 2023-06-20 NOTE — Discharge Instructions (Addendum)
-  You have a fracture of your rib.  It will take a couple of weeks for this to heal. - You may take Tylenol or naproxen for pain relief. - Ice the area. - Consider an Ace wrap or abdominal binder as well. - Make sure you are taking occasional deep breaths to prevent the air sacs from collapsing and leading to pneumonia. - If you start to have a fever, cough, shortness of breath, swelling in your legs, dizziness, fatigue or weakness please go to the ER.

## 2023-06-20 NOTE — ED Triage Notes (Signed)
Pt reports tripping on step one week ago and sustained a fall onto her knees and her elbow went into her left side. Left sided rib pain getting worse

## 2023-06-20 NOTE — ED Provider Notes (Signed)
[facility]    CSN: [id number] Arrival date & time: 06/20/23  3086      History   Chief Complaint Chief Complaint  Patient presents with   Fall    HPI Kristy Ford is a 67 y.o. female presenting for left rib pain that is been ongoing x 1 week.  Patient reports slipping and falling and jabbing her elbow into her left side accidentally about a week ago.  She reports symptoms were not that bad initially but she has been active over the past few days and reports getting hugged by a lot of people and now she says her left-sided rib pain has gotten a lot worse.  She reports increased pain with movement, deep breathing, coughing.  Denies feeling short of breath or having any chest pain, palpitations, increased leg swelling from baseline.  No report of head injury or loss of consciousness.  Has been taken naproxen.  Concern for fractures at this time.  No history of PE or DVT.  History of atrial fibrillation, not on any anticoagulants.  No other complaints.  HPI  Past Medical History:  Diagnosis Date   Actinic keratosis    Arthritis    lower back   Carotid artery stenosis    bilateral   Hx of atrial fibrillation without current medication 2014   Hyperlipidemia    Hypertension    Melanoma (HCC) 1991   L forearm    Osteoporosis    PONV (postoperative nausea and vomiting)     There are no problems to display for this patient.   Past Surgical History:  Procedure Laterality Date   ABDOMINAL HYSTERECTOMY     CATARACT EXTRACTION W/PHACO Right 11/11/2021   Procedure: CATARACT EXTRACTION PHACO AND INTRAOCULAR LENS PLACEMENT (IOC) RIGHT;  Surgeon: Nevada Crane, MD;  Location: [facility];  Service: Ophthalmology;  Laterality: Right;  9.48 00:47.7   CATARACT EXTRACTION W/PHACO Left 11/25/2021   Procedure: CATARACT EXTRACTION PHACO AND INTRAOCULAR LENS PLACEMENT (IOC) LEFT;  Surgeon: Nevada Crane, MD;  Location: [facility];  Service:  Ophthalmology;  Laterality: Left;  2.58 0:21.6    OB History   No obstetric history on file.      Home Medications    Prior to Admission medications   Medication Sig Start Date End Date Taking? Authorizing Provider  acetaminophen (TYLENOL) 500 MG tablet Take 500 mg by mouth at bedtime.    [provider]  Cholecalciferol (VITAMIN D3) 125 MCG (5000 UT) CAPS Take 10,000 Units by mouth daily.    [provider]  DHA-EPA-Vitamin E (OMEGA-3 COMPLEX PO) Take 2 capsules by mouth daily. doTerra    [provider]  ENTRESTO 24-26 MG Take 1 tablet by mouth 2 (two) times daily.    [provider]  MAGNESIUM PO Take by mouth daily.    [provider]  mometasone (ELOCON) 0.1 % cream Apply 1 Application topically as directed. Qd to Bid to rash on face for 2 weeks, then if clear d/c, if not clear decrease to qd 5 days a week 04/15/23   Deirdre Evener, MD  Multiple Vitamin (MULTIVITAMIN) capsule Take 2 capsules by mouth daily. doTerra    [provider]  Multiple Vitamins-Minerals (CELLULAR SECURITY PO) Take 2 capsules by mouth daily. doTerra    [provider]  mupirocin ointment (BACTROBAN) 2 % Apply 1 application topically daily. With dressing change 08/07/20   Deirdre Evener, MD  naproxen (NAPROSYN) 500 MG tablet Take  PO BID PRN PAIN 06/20/23   Eusebio Friendly B, PA-C  OVER THE COUNTER MEDICATION 2 capsules at bedtime. doTerra Restful Complex    [provider]  telmisartan (MICARDIS) 40 MG tablet Take 40 mg by mouth at bedtime.    [provider]    Family History Family History  Problem Relation Age of Onset   Breast cancer Mother    Breast cancer Sister     Social History Social History   Tobacco Use   Smoking status: Never   Smokeless tobacco: Never  Vaping Use   Vaping Use: Never used  Substance Use Topics   Alcohol use: Not Currently     Allergies   Penicillins and Latex   Review of  Systems Review of Systems  Constitutional:  Negative for fatigue and fever.  HENT:  Negative for congestion.   Respiratory:  Negative for cough, shortness of breath and wheezing.   Cardiovascular:  Negative for chest pain, palpitations and leg swelling.  Gastrointestinal:  Negative for abdominal pain.  Musculoskeletal:  Positive for arthralgias. Negative for back pain.  Skin:  Negative for color change and wound.  Neurological:  Negative for dizziness and weakness.     Physical Exam Triage Vital Signs ED Triage Vitals  Enc Vitals Group     BP 06/20/23 0824 136/85     Pulse Rate 06/20/23 0824 78     Resp 06/20/23 0824 17     Temp 06/20/23 0824 97.7 F (36.5 C)     Temp Source 06/20/23 0824 Oral     SpO2 06/20/23 0824 97 %     Weight 06/20/23 0818 158 lb (71.7 kg)     Height 06/20/23 0818 5\' 2"  (1.575 m)     Head Circumference --      Peak Flow --      Pain Score 06/20/23 0818 8     Pain Loc --      Pain Edu? --      Excl. in GC? --    No data found.  Updated Vital Signs BP 136/85 (BP Location: Right Arm)   Pulse 78   Temp 97.7 F (36.5 C) (Oral)   Resp 17   Ht 5\' 2"  (1.575 m)   Wt 158 lb (71.7 kg)   SpO2 97%   BMI 28.90 kg/m     Physical Exam Vitals and nursing note reviewed.  Constitutional:      General: She is not in acute distress.    Appearance: Normal appearance. She is not ill-appearing or toxic-appearing.  HENT:     Head: Normocephalic and atraumatic.     Nose: Nose normal.     Mouth/Throat:     Mouth: Mucous membranes are moist.     Pharynx: Oropharynx is clear.  Eyes:     General: No scleral icterus.       Right eye: No discharge.        Left eye: No discharge.     Conjunctiva/sclera: Conjunctivae normal.  Cardiovascular:     Rate and Rhythm: Normal rate and regular rhythm.     Heart sounds: Normal heart sounds.  Pulmonary:     Effort: Pulmonary effort is normal. No respiratory distress.     Breath sounds: Normal breath sounds. No  wheezing, rhonchi or rales.  Chest:     Chest wall: Tenderness (TTP left anterior ribs 6-8) present.  Musculoskeletal:     Cervical back: Neck supple.     Right lower leg: No edema.  Left lower leg: No edema.  Skin:    General: Skin is dry.  Neurological:     General: No focal deficit present.     Mental Status: She is alert. Mental status is at baseline.     Motor: No weakness.     Gait: Gait normal.  Psychiatric:        Mood and Affect: Mood normal.        Behavior: Behavior normal.        Thought Content: Thought content normal.      UC Treatments / Results  Labs (all labs ordered are listed, but only abnormal results are displayed) Labs Reviewed - No data to display  EKG   Radiology DG Ribs Unilateral W/Chest Left  Result Date: 06/20/2023 CLINICAL DATA:  Pain after fall 1 week ago. Evaluate for left rib fracture. EXAM: LEFT RIBS AND CHEST - 3+ VIEW COMPARISON:  None Available. FINDINGS: Suspect nondisplaced anterior left seventh rib fracture. Heart size appears within normal limits. No pleural fluid, interstitial edema, atelectasis or pneumothorax. IMPRESSION: Suspect nondisplaced anterior left seventh rib fracture. Electronically Signed   By: Signa Kell M.D.   On: 06/20/2023 09:20    Procedures Procedures (including critical care time)  Medications Ordered in UC Medications - No data to display  Initial Impression / Assessment and Plan / UC Course  I have reviewed the triage vital signs and the nursing notes.  Pertinent labs & imaging results that were available during my care of the patient were reviewed by me and considered in my medical decision making (see chart for details).   67 year old female presents for left-sided rib pain following a fall that occurred 1 week ago.  Symptoms recently worsened after being more active.  Has been taking NSAIDs for discomfort.  Denies shortness of breath, cough.  X-ray obtained today shows nondisplaced anterior left  seventh rib fracture.  This is exactly where patient is having discomfort.  Reviewed this result with her.  Discussed that rib fractures will heal themselves in a few weeks.  Reviewed care is supportive and pain management advised.  Patient reports she will continue taking naproxen and Tylenol.  She does not want to take anything stronger at this time.  Offered Ace wrap/abdominal binder but she reports she will get 1 over-the-counter.  We discussed the importance of taking occasional deep breaths to prevent atelectasis.  Reviewed return and ER precautions.   Final Clinical Impressions(s) / UC Diagnoses   Final diagnoses:  Closed fracture of one rib of left side, initial encounter     Discharge Instructions      -You have a fracture of your rib.  It will take a couple of weeks for this to heal. - You may take Tylenol or naproxen for pain relief. - Ice the area. - Consider an Ace wrap or abdominal binder as well. - Make sure you are taking occasional deep breaths to prevent the air sacs from collapsing and leading to pneumonia. - If you start to have a fever, cough, shortness of breath, swelling in your legs, dizziness, fatigue or weakness please go to the ER.     ED Prescriptions     Medication Sig Dispense Auth. Provider   naproxen (NAPROSYN) 500 MG tablet Take PO BID PRN PAIN 30 tablet Shirlee Latch, PA-C      PDMP not reviewed this encounter.   Shirlee Latch, PA-C 06/20/23 1017

## 2023-06-20 NOTE — ED Notes (Signed)
Husband at bedside.  

## 2023-06-29 ENCOUNTER — Other Ambulatory Visit
Admission: RE | Admit: 2023-06-29 | Discharge: 2023-06-29 | Disposition: A | Discharge status: Home / Self Care | Payer: Medicare Other | Source: Ambulatory Visit | Attending: Nurse Practitioner | Admitting: Nurse Practitioner

## 2023-06-29 DIAGNOSIS — I5020 Unspecified systolic (congestive) heart failure: Secondary | ICD-10-CM | POA: Insufficient documentation

## 2023-06-29 DIAGNOSIS — R600 Localized edema: Secondary | ICD-10-CM | POA: Diagnosis present

## 2023-06-29 LAB — BRAIN NATRIURETIC PEPTIDE: B Natriuretic Peptide: 61.2 pg/mL (ref 0.0–100.0)

## 2023-07-27 ENCOUNTER — Other Ambulatory Visit
Admission: RE | Admit: 2023-07-27 | Discharge: 2023-07-27 | Disposition: A | Discharge status: Home / Self Care | Payer: Medicare Other | Source: Ambulatory Visit | Attending: Nurse Practitioner | Admitting: Nurse Practitioner

## 2023-07-27 DIAGNOSIS — R600 Localized edema: Secondary | ICD-10-CM | POA: Insufficient documentation

## 2023-07-27 LAB — BRAIN NATRIURETIC PEPTIDE: B Natriuretic Peptide: 48.9 pg/mL (ref 0.0–100.0)

## 2023-09-02 ENCOUNTER — Ambulatory Visit: Payer: Medicare Other | Admitting: Dermatology

## 2023-11-04 ENCOUNTER — Ambulatory Visit: Payer: Medicare Other

## 2023-11-04 ENCOUNTER — Ambulatory Visit
Admission: EM | Admit: 2023-11-04 | Discharge: 2023-11-04 | Disposition: A | Discharge status: Home / Self Care | Payer: Medicare Other | Attending: Family Medicine | Admitting: Family Medicine

## 2023-11-04 DIAGNOSIS — S9032XA Contusion of left foot, initial encounter: Secondary | ICD-10-CM | POA: Diagnosis not present

## 2023-11-04 DIAGNOSIS — M79672 Pain in left foot: Secondary | ICD-10-CM | POA: Diagnosis not present

## 2023-11-04 NOTE — ED Provider Notes (Signed)
[facility]    CSN: [id number] Arrival date & time: 11/04/23  4098      History   Chief Complaint Chief Complaint  Patient presents with   Foot Pain    HPI  HPI Kristy Ford is a 67 y.o. female.   Kristy Ford presents for left foot pain for the past 3 days after colliding with a friend at church 3 days ago.  The friend stepped on her foot. She had immediate pain.  Has swelling and bruising of her foot. Doesn't have pain with walking but does have some comfort. Feels "squishy." Took Tylenol and Naprosyn for her chronic back pain.       Past Medical History:  Diagnosis Date   Actinic keratosis    Arthritis    lower back   Carotid artery stenosis    bilateral   Hx of atrial fibrillation without current medication 2014   Hyperlipidemia    Hypertension    Melanoma (HCC) 1991   L forearm    Osteoporosis    PONV (postoperative nausea and vomiting)     There are no problems to display for this patient.   Past Surgical History:  Procedure Laterality Date   ABDOMINAL HYSTERECTOMY     CATARACT EXTRACTION W/PHACO Right 11/11/2021   Procedure: CATARACT EXTRACTION PHACO AND INTRAOCULAR LENS PLACEMENT (IOC) RIGHT;  Surgeon: Nevada Crane, MD;  Location: [facility];  Service: Ophthalmology;  Laterality: Right;  9.48 00:47.7   CATARACT EXTRACTION W/PHACO Left 11/25/2021   Procedure: CATARACT EXTRACTION PHACO AND INTRAOCULAR LENS PLACEMENT (IOC) LEFT;  Surgeon: Nevada Crane, MD;  Location: [facility];  Service: Ophthalmology;  Laterality: Left;  2.58 0:21.6    OB History   No obstetric history on file.      Home Medications    Prior to Admission medications   Medication Sig Start Date End Date Taking? Authorizing Provider  acetaminophen (TYLENOL) 500 MG tablet Take 500 mg by mouth at bedtime.   Yes [provider]  Cholecalciferol (VITAMIN D3) 125 MCG (5000 UT) CAPS Take 10,000 Units by mouth daily.   Yes [provider]  DHA-EPA-Vitamin E (OMEGA-3 COMPLEX PO) Take 2 capsules by mouth daily. doTerra   Yes [provider]  ENTRESTO 24-26 MG Take 1 tablet by mouth 2 (two) times daily.   Yes [provider]  furosemide (LASIX) 20 MG tablet Take 40 mg by mouth daily. 07/28/23  Yes [provider]  MAGNESIUM PO Take by mouth daily.   Yes [provider]  mometasone (ELOCON) 0.1 % cream Apply 1 Application topically as directed. Qd to Bid to rash on face for 2 weeks, then if clear d/c, if not clear decrease to qd 5 days a week 04/15/23  Yes Deirdre Evener, MD  Multiple Vitamin (MULTIVITAMIN) capsule Take 2 capsules by mouth daily. doTerra   Yes [provider]  Multiple Vitamins-Minerals (CELLULAR SECURITY PO) Take 2 capsules by mouth daily. doTerra   Yes [provider]  mupirocin ointment (BACTROBAN) 2 % Apply 1 application topically daily. With dressing change 08/07/20  Yes Deirdre Evener, MD  naproxen (NAPROSYN) 500 MG tablet Take PO BID PRN PAIN 06/20/23  Yes Eusebio Friendly B, PA-C  OVER THE COUNTER MEDICATION 2 capsules at bedtime. doTerra Restful Complex   Yes [provider]  telmisartan (MICARDIS) 40 MG tablet Take 40 mg by mouth at bedtime.   Yes [provider]    Family History Family  History  Problem Relation Age of Onset   Breast cancer Mother    Breast cancer Sister     Social History Social History   Tobacco Use   Smoking status: Never   Smokeless tobacco: Never  Vaping Use   Vaping status: Never Used  Substance Use Topics   Alcohol use: Not Currently     Allergies   Penicillins, Sacubitril-valsartan, and Latex   Review of Systems Review of Systems: :negative unless otherwise stated in HPI.      Physical Exam Triage Vital Signs ED Triage Vitals  Encounter Vitals Group     BP 11/04/23 0942 136/84     Systolic BP Percentile --      Diastolic BP Percentile --      Pulse Rate 11/04/23 0942 72      Resp 11/04/23 0942 16     Temp 11/04/23 0942 98.7 F (37.1 C)     Temp Source 11/04/23 0942 Oral     SpO2 11/04/23 0942 95 %     Weight 11/04/23 0941 160 lb (72.6 kg)     Height 11/04/23 0941 5\' 2"  (1.575 m)     Head Circumference --      Peak Flow --      Pain Score 11/04/23 0944 0     Pain Loc --      Pain Education --      Exclude from Growth Chart --    No data found.  Updated Vital Signs BP 136/84 (BP Location: Left Arm)   Pulse 72   Temp 98.7 F (37.1 C) (Oral)   Resp 16   Ht 5\' 2"  (1.575 m)   Wt 72.6 kg   SpO2 95%   BMI 29.26 kg/m   Visual Acuity Right Eye Distance:   Left Eye Distance:   Bilateral Distance:    Right Eye Near:   Left Eye Near:    Bilateral Near:     Physical Exam GEN: well appearing female in no acute distress  CVS: well perfused  RESP: speaking in full sentences without pause, no respiratory distress  MSK:  Ankle/Foot, left: TTP noted at the distal 2nd-4th metatarsals with visible erythema,  swelling, ecchymosis but  bony deformity.  No evidence of tibiotalar deviation; Range of motion is full in all directions. Strength is 5/5 in all directions. No tenderness at the insertion/body/myotendinous junction of the Achilles tendon; No tenderness on posterior aspects of lateral and medial malleolus; Talar dome non-tender; Unremarkable calcaneal squeeze; No tenderness over the navicular prominence or  over cuboid; No pain at base of 5th MT;  Able to walk 4 steps.     UC Treatments / Results  Labs (all labs ordered are listed, but only abnormal results are displayed) Labs Reviewed - No data to display  EKG   Radiology DG Foot Complete Left  Result Date: 11/04/2023 CLINICAL DATA:  Anterior pain. EXAM: LEFT FOOT - COMPLETE 3 VIEW COMPARISON:  None Available. FINDINGS: No fracture or dislocation. Osteopenia. Hallux valgus deformity of the first ray with hypertrophic degenerative change. IMPRESSION: Hallux valgus deformity of the first ray  with hypertrophic changes. No acute osseous abnormality. Electronically Signed   By: Karen Kays M.D.   On: 11/04/2023 14:11     Procedures Procedures (including critical care time)  Medications Ordered in UC Medications - No data to display  Initial Impression / Assessment and Plan / UC Course  I have reviewed the triage vital signs and the nursing notes.  Pertinent  labs & imaging results that were available during my care of the patient were reviewed by me and considered in my medical decision making (see chart for details).      Pt is a 67 y.o.  female with 3 days of  left foot  pain after her friend stepped on her foot in church.   Declined po and IM pain control here.   On exam, pt has tenderness at  the distal 2nd-4th metatarsals with visible erythema,  swelling, ecchymosis concerning for fracture.   Obtained  left foot  plain films.  Personally interpreted by me were unremarkable for fracture or dislocation. Has bony irregularity of hallux valgus deformity. Patient aware the radiologist has not read her xray and is comfortable with the preliminary read by me. Will review radiologist read when available and call patient if a change in plan is warranted.  Pt agreeable to this plan prior to discharge.   Suspect soft tissue contusion.  Patient to gradually return to normal activities, as tolerated and continue ordinary activities within the limits permitted by pain.  Continue Naprosyn and Tylenol PRN.   Patient to follow up with orthopedic provider, if symptoms do not improve with conservative treatment.  Return and ED precautions given. Understanding voiced. Discussed MDM, treatment plan and plan for follow-up with patient who agrees with plan.   Radiologist impression reviewed.  Final Clinical Impressions(s) / UC Diagnoses   Final diagnoses:  Contusion of left foot, initial encounter  Foot pain, left     Discharge Instructions      Apply warm compresses to promote healing.   Continue taking Tylenol and Motrin as needed for pain.  If pain persist, follow-up with either Dr. Les Pou at [facility] clinic or [facility].      ED Prescriptions   None    PDMP not reviewed this encounter.   Katha Cabal, DO 11/04/23 1454

## 2023-11-04 NOTE — Discharge Instructions (Addendum)
Apply warm compresses to promote healing.  Continue taking Tylenol and Motrin as needed for pain.  If pain persist, follow-up with either Dr. Les Pou at [facility] clinic or [facility].

## 2023-11-04 NOTE — ED Triage Notes (Signed)
Pt c/o L foot pain & bruising x3 days. States she collided w/her friend & she stepped on her foot.

## 2023-11-18 ENCOUNTER — Ambulatory Visit: Payer: Medicare Other | Admitting: Dermatology

## 2023-11-30 ENCOUNTER — Emergency Department
Admission: EM | Admit: 2023-11-30 | Discharge: 2023-11-30 | Disposition: A | Discharge status: Home / Self Care | Payer: Medicare Other | Attending: Emergency Medicine | Admitting: Emergency Medicine

## 2023-11-30 ENCOUNTER — Emergency Department: Payer: Medicare Other

## 2023-11-30 ENCOUNTER — Encounter: Payer: Self-pay | Admitting: Emergency Medicine

## 2023-11-30 ENCOUNTER — Other Ambulatory Visit: Payer: Self-pay

## 2023-11-30 DIAGNOSIS — K8020 Calculus of gallbladder without cholecystitis without obstruction: Secondary | ICD-10-CM | POA: Diagnosis not present

## 2023-11-30 DIAGNOSIS — R079 Chest pain, unspecified: Secondary | ICD-10-CM

## 2023-11-30 DIAGNOSIS — I110 Hypertensive heart disease with heart failure: Secondary | ICD-10-CM | POA: Diagnosis not present

## 2023-11-30 DIAGNOSIS — I509 Heart failure, unspecified: Secondary | ICD-10-CM | POA: Diagnosis not present

## 2023-11-30 DIAGNOSIS — R0789 Other chest pain: Secondary | ICD-10-CM | POA: Diagnosis present

## 2023-11-30 LAB — CBC WITH DIFFERENTIAL/PLATELET
Abs Immature Granulocytes: 0.02 10*3/uL (ref 0.00–0.07)
Basophils Absolute: 0 10*3/uL (ref 0.0–0.1)
Basophils Relative: 1 %
Eosinophils Absolute: 0.1 10*3/uL (ref 0.0–0.5)
Eosinophils Relative: 1 %
HCT: 48.1 % — ABNORMAL HIGH (ref 36.0–46.0)
Hemoglobin: 16.1 g/dL — ABNORMAL HIGH (ref 12.0–15.0)
Immature Granulocytes: 0 %
Lymphocytes Relative: 23 %
Lymphs Abs: 1.5 10*3/uL (ref 0.7–4.0)
MCH: 30.1 pg (ref 26.0–34.0)
MCHC: 33.5 g/dL (ref 30.0–36.0)
MCV: 89.9 fL (ref 80.0–100.0)
Monocytes Absolute: 0.5 10*3/uL (ref 0.1–1.0)
Monocytes Relative: 8 %
Neutro Abs: 4.4 10*3/uL (ref 1.7–7.7)
Neutrophils Relative %: 67 %
Platelets: 250 10*3/uL (ref 150–400)
RBC: 5.35 MIL/uL — ABNORMAL HIGH (ref 3.87–5.11)
RDW: 12.5 % (ref 11.5–15.5)
WBC: 6.5 10*3/uL (ref 4.0–10.5)
nRBC: 0 % (ref 0.0–0.2)

## 2023-11-30 LAB — BASIC METABOLIC PANEL
Anion gap: 13 (ref 5–15)
BUN: 17 mg/dL (ref 8–23)
CO2: 21 mmol/L — ABNORMAL LOW (ref 22–32)
Calcium: 10.2 mg/dL (ref 8.9–10.3)
Chloride: 103 mmol/L (ref 98–111)
Creatinine, Ser: 0.72 mg/dL (ref 0.44–1.00)
GFR, Estimated: >60 mL/min (ref >60)
Glucose, Bld: 118 mg/dL — ABNORMAL HIGH (ref 70–99)
Potassium: 3.5 mmol/L (ref 3.5–5.1)
Sodium: 137 mmol/L (ref 135–145)

## 2023-11-30 LAB — TROPONIN I (HIGH SENSITIVITY)
Troponin I (High Sensitivity): 9 ng/L (ref <18)
Troponin I (High Sensitivity): 9 ng/L (ref <18)

## 2023-11-30 LAB — BRAIN NATRIURETIC PEPTIDE: B Natriuretic Peptide: 60.9 pg/mL (ref 0.0–100.0)

## 2023-11-30 MED ORDER — IOHEXOL 350 MG/ML SOLN
100.0000 mL | Freq: Once | INTRAVENOUS | Status: AC | PRN
  Administered 2023-11-30: 100 mL via INTRAVENOUS

## 2023-11-30 NOTE — ED Triage Notes (Signed)
Patient to ED via POV for centralized CP that started a few hours ago. States feel like pressure on chest that intermittently radiates into back. Hx of heart failure.

## 2023-11-30 NOTE — ED Provider Triage Note (Signed)
Emergency Medicine Provider Triage Evaluation Note  Kristy Ford , a 67 y.o. female  was evaluated in triage.  Pt complains of chest pain that began a few hours ago. Was significant pain at first, now feels heavy and achey.  Review of Systems  Positive: Chest pain, SOB Negative:   Physical Exam  Pulse 95   Temp 98.2 F (36.8 C) (Oral)   Resp 17   SpO2 98%  Gen:   Awake, no distress   Resp:  Normal effort  MSK:   Moves extremities without difficulty  Other:    Medical Decision Making  Medically screening exam initiated at 2:39 PM.  Appropriate orders placed.  Kristy Ford was informed that the remainder of the evaluation will be completed by another provider, this initial triage assessment does not replace that evaluation, and the importance of remaining in the ED until their evaluation is complete.     Cameron Ali, PA-C 11/30/23 1441

## 2023-11-30 NOTE — ED Provider Notes (Signed)
[facility] Provider Note    Event Date/Time   First MD Initiated Contact with Patient 11/30/23 [phone]     (approximate)   History   Chief Complaint Chest Pain   HPI  Kristy Ford is a 67 y.o. female with past medical history of hypertension, hyperlipidemia, CHF, nonischemic cardiomyopathy, and atrial fibrillation who presents to the ED complaining of chest pain.  She describes onset of significant pressure in her chest radiating towards her back while at rest this afternoon.  Pain has since eased up in severity but she continues to feel a heaviness as well as discomfort between her shoulder blades.  She reports feeling mildly short of breath but denies any fevers or cough.  She has chronic swelling in her legs that is unchanged today.     Physical Exam   Triage Vital Signs: ED Triage Vitals  Encounter Vitals Group     BP 11/30/23 1435 (!) 190/104     Systolic BP Percentile --      Diastolic BP Percentile --      Pulse Rate 11/30/23 1435 95     Resp 11/30/23 1435 17     Temp 11/30/23 1435 98.2 F (36.8 C)     Temp Source 11/30/23 1435 Oral     SpO2 11/30/23 1435 98 %     Weight 11/30/23 1440 162 lb (73.5 kg)     Height 11/30/23 1440 5\' 2"  (1.575 m)     Head Circumference --      Peak Flow --      Pain Score 11/30/23 1440 6     Pain Loc --      Pain Education --      Exclude from Growth Chart --     Most recent vital signs: Vitals:   11/30/23 1435 11/30/23 1704  BP: (!) 190/104 (!) 165/103  Pulse: 95 77  Resp: 17 18  Temp: 98.2 F (36.8 C) 98 F (36.7 C)  SpO2: 98% 95%    Constitutional: Alert and oriented. Eyes: Conjunctivae are normal. Head: Atraumatic. Nose: No congestion/rhinnorhea. Mouth/Throat: Mucous membranes are moist.  Cardiovascular: Normal rate, regular rhythm. Grossly normal heart sounds.  2+ radial pulses bilaterally. Respiratory: Normal respiratory effort.  No retractions. Lungs CTAB.  No chest wall tenderness to  palpation. Gastrointestinal: Soft and nontender. No distention. Musculoskeletal: No lower extremity tenderness nor edema.  Neurologic:  Normal speech and language. No gross focal neurologic deficits are appreciated.    ED Results / Procedures / Treatments   Labs (all labs ordered are listed, but only abnormal results are displayed) Labs Reviewed  BASIC METABOLIC PANEL - Abnormal; Notable for the following components:      Result Value   CO2 21 (*)    Glucose, Bld 118 (*)    All other components within normal limits  CBC WITH DIFFERENTIAL/PLATELET - Abnormal; Notable for the following components:   RBC 5.35 (*)    Hemoglobin 16.1 (*)    HCT 48.1 (*)    All other components within normal limits  BRAIN NATRIURETIC PEPTIDE  TROPONIN I (HIGH SENSITIVITY)  TROPONIN I (HIGH SENSITIVITY)     EKG  ED ECG REPORT I, Chesley Noon, the attending physician, personally viewed and interpreted this ECG.   Date: 11/30/2023  EKG Time: 14:34  Rate: 92  Rhythm: normal sinus rhythm  Axis: Normal  Intervals:left bundle branch block  ST&T Change: None  RADIOLOGY Chest x-ray reviewed and interpreted by me with no infiltrate,  edema, or effusion.  PROCEDURES:  Critical Care performed: No  Procedures   MEDICATIONS ORDERED IN ED: Medications  iohexol (OMNIPAQUE) 350 MG/ML injection 100 mL (100 mLs Intravenous Contrast Given 11/30/23 1949)     IMPRESSION / MDM / ASSESSMENT AND PLAN / ED COURSE  I reviewed the triage vital signs and the nursing notes.                              67 y.o. female with past medical history of hypertension, hyperlipidemia, atrial fibrillation, CHF, and nonischemic cardiomyopathy who presents to the ED complaining of chest pressure radiating towards her back since this afternoon.  Patient's presentation is most consistent with acute presentation with potential threat to life or bodily function.  Differential diagnosis includes, but is not limited to,  ACS, PE, dissection, pneumonia, pneumothorax, musculoskeletal pain, GERD, anxiety.  Patient well-appearing and in no acute distress, vital signs remarkable for hypertension but otherwise reassuring.  EKG shows left bundle branch block similar to previous, no ischemic changes noted and 2 sets of troponin are within normal limits.  Chest x-ray is unremarkable and remainder of labs are reassuring with no significant anemia, leukocytosis, electrolyte abnormality, or AKI.  Given her hypertension and pain radiating towards her back, we will further assess with CTA of her chest to rule out dissection.  CTA of chest/abdomen/pelvis is negative for aortic syndrome or other acute process.  Imaging does note cholelithiasis without evidence of cholecystitis, it does seem possible that biliary colic contributed to her symptoms today.  She is asymptomatic on reassessment and appropriate for outpatient follow-up with cardiology and general surgery.  She was counseled to return to the ED for new or worsening symptoms, patient agrees with plan.      FINAL CLINICAL IMPRESSION(S) / ED DIAGNOSES   Final diagnoses:  Nonspecific chest pain  Calculus of gallbladder without cholecystitis without obstruction     Rx / DC Orders   ED Discharge Orders     None        Note:  This document was prepared using Dragon voice recognition software and may include unintentional dictation errors.   Chesley Noon, MD 11/30/23 2227

## 2024-03-30 ENCOUNTER — Encounter: Payer: Self-pay | Admitting: Dermatology

## 2024-03-30 ENCOUNTER — Ambulatory Visit: Payer: Medicare Other | Admitting: Dermatology

## 2024-03-30 DIAGNOSIS — Z85820 Personal history of malignant melanoma of skin: Secondary | ICD-10-CM

## 2024-03-30 DIAGNOSIS — L814 Other melanin hyperpigmentation: Secondary | ICD-10-CM | POA: Diagnosis not present

## 2024-03-30 DIAGNOSIS — L820 Inflamed seborrheic keratosis: Secondary | ICD-10-CM | POA: Diagnosis not present

## 2024-03-30 DIAGNOSIS — Z1283 Encounter for screening for malignant neoplasm of skin: Secondary | ICD-10-CM | POA: Diagnosis not present

## 2024-03-30 DIAGNOSIS — W908XXA Exposure to other nonionizing radiation, initial encounter: Secondary | ICD-10-CM

## 2024-03-30 DIAGNOSIS — Z79899 Other long term (current) drug therapy: Secondary | ICD-10-CM

## 2024-03-30 DIAGNOSIS — Z7189 Other specified counseling: Secondary | ICD-10-CM

## 2024-03-30 DIAGNOSIS — D1801 Hemangioma of skin and subcutaneous tissue: Secondary | ICD-10-CM

## 2024-03-30 DIAGNOSIS — L578 Other skin changes due to chronic exposure to nonionizing radiation: Secondary | ICD-10-CM

## 2024-03-30 DIAGNOSIS — L821 Other seborrheic keratosis: Secondary | ICD-10-CM

## 2024-03-30 DIAGNOSIS — R21 Rash and other nonspecific skin eruption: Secondary | ICD-10-CM

## 2024-03-30 DIAGNOSIS — D229 Melanocytic nevi, unspecified: Secondary | ICD-10-CM

## 2024-03-30 NOTE — Progress Notes (Signed)
 Follow-Up Visit   Subjective  Kristy Ford is a 68 y.o. female who presents for the following: Skin Cancer Screening and Full Body Skin Exam  The patient presents for Total-Body Skin Exam (TBSE) for skin cancer screening and mole check. The patient has spots, moles and lesions to be evaluated, some may be new or changing and the patient may have concern these could be cancer.  The following portions of the chart were reviewed this encounter and updated as appropriate: medications, allergies, medical history  Review of Systems:  No other skin or systemic complaints except as noted in HPI or Assessment and Plan.  Objective  Well appearing patient in no apparent distress; mood and affect are within normal limits.  A full examination was performed including scalp, head, eyes, ears, nose, lips, neck, chest, axillae, abdomen, back, buttocks, bilateral upper extremities, bilateral lower extremities, hands, feet, fingers, toes, fingernails, and toenails. All findings within normal limits unless otherwise noted below.   Relevant physical exam findings are noted in the Assessment and Plan.  L forehead x 1 Erythematous stuck-on, waxy papule or plaque  Assessment & Plan   SKIN CANCER SCREENING PERFORMED TODAY.  ACTINIC DAMAGE - Chronic condition, secondary to cumulative UV/sun exposure - diffuse scaly erythematous macules with underlying dyspigmentation - Recommend daily broad spectrum sunscreen SPF 30+ to sun-exposed areas, reapply every 2 hours as needed.  - Staying in the shade or wearing long sleeves, sun glasses (UVA+UVB protection) and wide brim hats (4-inch brim around the entire circumference of the hat) are also recommended for sun protection.  - Call for new or changing lesions.  LENTIGINES, SEBORRHEIC KERATOSES, HEMANGIOMAS - Benign normal skin lesions - Benign-appearing - Call for any changes  MELANOCYTIC NEVI - Tan-brown and/or pink-flesh-colored symmetric macules and  papules - Benign appearing on exam today - Observation - Call clinic for new or changing moles - Recommend daily use of broad spectrum spf 30+ sunscreen to sun-exposed areas.   HISTORY OF MELANOMA - L volar forearm, invasive, treated 1991 - No evidence of recurrence today - No lymphadenopathy - Recommend regular full body skin exams - Recommend daily broad spectrum sunscreen SPF 30+ to sun-exposed areas, reapply every 2 hours as needed.  - Call if any new or changing lesions are noted between office visits   INFLAMED SEBORRHEIC KERATOSIS L forehead x 1 Symptomatic, irritating, patient would like treated.  Destruction of lesion - L forehead x 1 Complexity: simple   Destruction method: cryotherapy   Informed consent: discussed and consent obtained   Timeout:  patient name, date of birth, surgical site, and procedure verified Lesion destroyed using liquid nitrogen: Yes   Region frozen until ice ball extended beyond lesion: Yes   Outcome: patient tolerated procedure well with no complications   Post-procedure details: wound care instructions given    RASH Contact vs Irritant Dermatitis Exam: Face clear today Treatment Plan: Continue if flare with mometasone cream bid for 2 weeks to face then can d/c.   If rash not clear can decrease to qd 5d/wk until clear.  Recommend True Patch Test 36 if current persistent problem.   Topical steroids (such as triamcinolone, fluocinolone, fluocinonide, mometasone, clobetasol, halobetasol, betamethasone, hydrocortisone) can cause thinning and lightening of the skin if they are used for too long in the same area. Your physician has selected the right strength medicine for your problem and area affected on the body. Please use your medication only as directed by your physician to prevent side effects.  Return in about 1 year (around 03/30/2025) for TBSE - hx AK, ISK, MM.  I, Mara Seminole, CMA, am acting as scribe for Celine Collard, MD  .   Documentation: I have reviewed the above documentation for accuracy and completeness, and I agree with the above.  Celine Collard, MD

## 2024-03-30 NOTE — Patient Instructions (Signed)

## 2024-06-08 ENCOUNTER — Other Ambulatory Visit: Payer: Self-pay | Admitting: Internal Medicine

## 2024-06-08 DIAGNOSIS — Z1231 Encounter for screening mammogram for malignant neoplasm of breast: Secondary | ICD-10-CM

## 2024-06-21 ENCOUNTER — Encounter

## 2024-06-21 ENCOUNTER — Ambulatory Visit
Admission: RE | Admit: 2024-06-21 | Discharge: 2024-06-21 | Disposition: A | Discharge status: Home / Self Care | Source: Ambulatory Visit | Attending: Internal Medicine | Admitting: Internal Medicine

## 2024-06-21 DIAGNOSIS — Z1231 Encounter for screening mammogram for malignant neoplasm of breast: Secondary | ICD-10-CM | POA: Diagnosis present

## 2024-11-03 ENCOUNTER — Encounter: Payer: Self-pay | Admitting: *Deleted

## 2024-11-14 ENCOUNTER — Encounter
Admission: RE | Disposition: A | Discharge status: Home / Self Care | Payer: Self-pay | Source: Home / Self Care | Attending: Gastroenterology

## 2024-11-14 ENCOUNTER — Ambulatory Visit: Admitting: Anesthesiology

## 2024-11-14 ENCOUNTER — Ambulatory Visit
Admission: RE | Admit: 2024-11-14 | Discharge: 2024-11-14 | Disposition: A | Discharge status: Home / Self Care | Attending: Gastroenterology | Admitting: Gastroenterology

## 2024-11-14 ENCOUNTER — Other Ambulatory Visit: Payer: Self-pay

## 2024-11-14 ENCOUNTER — Encounter: Payer: Self-pay | Admitting: Gastroenterology

## 2024-11-14 HISTORY — PX: COLONOSCOPY: SHX5424

## 2024-11-14 HISTORY — DX: Heart failure, unspecified: I50.9

## 2024-11-14 HISTORY — PX: POLYPECTOMY: SHX149

## 2024-11-14 SURGERY — COLONOSCOPY
Anesthesia: General

## 2024-11-14 MED ORDER — LIDOCAINE HCL (CARDIAC) PF 100 MG/5ML IV SOSY
PREFILLED_SYRINGE | INTRAVENOUS | Status: DC | PRN
  Administered 2024-11-14: 100 mg via INTRAVENOUS

## 2024-11-14 MED ORDER — SODIUM CHLORIDE 0.9 % IV SOLN: INTRAVENOUS | Status: DC

## 2024-11-14 MED ORDER — PROPOFOL 500 MG/50ML IV EMUL
INTRAVENOUS | Status: DC | PRN
  Administered 2024-11-14: 20 mg via INTRAVENOUS
  Administered 2024-11-14: 150 ug/kg/min via INTRAVENOUS
  Administered 2024-11-14: 30 mg via INTRAVENOUS

## 2024-11-14 NOTE — Interval H&P Note (Signed)
 History and Physical Interval Note:  11/14/2024 8:29 AM  Kristy Ford  has presented today for surgery, with the diagnosis of HX OF ADENOMATOUS POLYP OF COLON.  The various methods of treatment have been discussed with the patient and family. After consideration of risks, benefits and other options for treatment, the patient has consented to  Procedure(s): COLONOSCOPY (N/A) as a surgical intervention.  The patient's history has been reviewed, patient examined, no change in status, stable for surgery.  I have reviewed the patient's chart and labs.  Questions were answered to the patient's satisfaction.     Ole ONEIDA Schick  Ok to proceed with colonoscopy

## 2024-11-14 NOTE — Anesthesia Preprocedure Evaluation (Signed)
 Anesthesia Evaluation  Patient identified by MRN, date of birth, ID band Patient awake    Reviewed: Allergy & Precautions, NPO status , Patient's Chart, lab work & pertinent test results  History of Anesthesia Complications (+) PONV and history of anesthetic complications  Airway Mallampati: III  TM Distance: <3 FB Neck ROM: full    Dental  (+) Chipped   Pulmonary neg pulmonary ROS, neg shortness of breath   Pulmonary exam normal        Cardiovascular Exercise Tolerance: Good hypertension, (-) angina +CHF  + dysrhythmias Atrial Fibrillation      Neuro/Psych negative neurological ROS  negative psych ROS   GI/Hepatic negative GI ROS, Neg liver ROS,neg GERD  ,,  Endo/Other  negative endocrine ROS    Renal/GU negative Renal ROS  negative genitourinary   Musculoskeletal   Abdominal   Peds  Hematology negative hematology ROS (+)   Anesthesia Other Findings Past Medical History: No date: Actinic keratosis No date: Arthritis     Comment:  lower back No date: Carotid artery stenosis     Comment:  bilateral No date: CHF (congestive heart failure) (HCC) 2014: Hx of atrial fibrillation without current medication No date: Hyperlipidemia No date: Hypertension 1991: Melanoma (HCC)     Comment:  L forearm  No date: Osteoporosis No date: PONV (postoperative nausea and vomiting)  Past Surgical History: No date: ABDOMINAL HYSTERECTOMY 11/11/2021: CATARACT EXTRACTION W/PHACO; Right     Comment:  Procedure: CATARACT EXTRACTION PHACO AND INTRAOCULAR               LENS PLACEMENT (IOC) RIGHT;  Surgeon: Myrna Adine Anes,              MD;  Location: [facility];  Service:               Ophthalmology;  Laterality: Right;  9.48 00:47.7 11/25/2021: CATARACT EXTRACTION W/PHACO; Left     Comment:  Procedure: CATARACT EXTRACTION PHACO AND INTRAOCULAR               LENS PLACEMENT (IOC) LEFT;  Surgeon: Myrna Adine Anes,                MD;  Location: [facility];  Service:               Ophthalmology;  Laterality: Left;  2.58 0:21.6 No date: EYE SURGERY  BMI    Body Mass Index: 24.91 kg/m      Reproductive/Obstetrics negative OB ROS                              Anesthesia Physical Anesthesia Plan  ASA: 3  Anesthesia Plan: General   Post-op Pain Management:    Induction: Intravenous  PONV Risk Score and Plan: Propofol infusion and TIVA  Airway Management Planned: Natural Airway and Nasal Cannula  Additional Equipment:   Intra-op Plan:   Post-operative Plan:   Informed Consent: I have reviewed the patients History and Physical, chart, labs and discussed the procedure including the risks, benefits and alternatives for the proposed anesthesia with the patient or authorized representative who has indicated his/her understanding and acceptance.     Dental Advisory Given  Plan Discussed with: Anesthesiologist, CRNA and Surgeon  Anesthesia Plan Comments: (Patient consented for risks of anesthesia including but not limited to:  - adverse reactions to medications - risk of airway placement if required - damage to eyes, teeth, lips or  other oral mucosa - nerve damage due to positioning  - sore throat or hoarseness - Damage to heart, brain, nerves, lungs, other parts of body or loss of life  Patient voiced understanding and assent.)        Anesthesia Quick Evaluation

## 2024-11-14 NOTE — Anesthesia Postprocedure Evaluation (Signed)
 Anesthesia Post Note  Patient: Kristy Ford  Procedure(s) Performed: COLONOSCOPY POLYPECTOMY, INTESTINE  Patient location during evaluation: Endoscopy Anesthesia Type: General Level of consciousness: awake and alert Pain management: pain level controlled Vital Signs Assessment: post-procedure vital signs reviewed and stable Respiratory status: spontaneous breathing, nonlabored ventilation and respiratory function stable Cardiovascular status: blood pressure returned to baseline and stable Postop Assessment: no apparent nausea or vomiting Anesthetic complications: no   There were no known notable events for this encounter.   Last Vitals:  Vitals:   11/14/24 0853 11/14/24 0903  BP: 114/63 113/66  Pulse: 69 66  Resp: 20 14  Temp: (!) 35.9 C   SpO2: 97% 98%    Last Pain:  Vitals:   11/14/24 0903  TempSrc:   PainSc: 0-No pain                 [name] POUR Deaven Barron

## 2024-11-14 NOTE — Transfer of Care (Signed)
 Immediate Anesthesia Transfer of Care Note  Patient: Kristy Ford  Procedure(s) Performed: COLONOSCOPY POLYPECTOMY, INTESTINE  Patient Location: PACU  Anesthesia Type:General  Level of Consciousness: awake and patient cooperative  Airway & Oxygen Therapy: Patient Spontanous Breathing  Post-op Assessment: Report given to RN and Post -op Vital signs reviewed and stable  Post vital signs: stable  Last Vitals:  Vitals Value Taken Time  BP    Temp    Pulse 69 11/14/24 08:53  Resp 20 11/14/24 08:53  SpO2 97 % 11/14/24 08:53  Vitals shown include unfiled device data.  Last Pain:  Vitals:   11/14/24 0737  TempSrc: Tympanic  PainSc: 0-No pain         Complications: There were no known notable events for this encounter.

## 2024-11-14 NOTE — H&P (Signed)
 Outpatient short stay form Pre-procedure 11/14/2024  Kristy ONEIDA Schick, MD  Primary Physician: Sherial Bail, MD  Reason for visit:  Surveillance  History of present illness:    68 y/o lady with history of > 20 hyperplastic polyps, NICM (EF 35%), and hypertension here for surveillance colonoscopy. Last colonoscopy 5 years ago with Ta's and SSA's. No blood thinners. History of hysterectomy and appendectomy. Sister with ovarian cancer in late 75's.    Current Facility-Administered Medications:    0.9 %  sodium chloride infusion, , Intravenous, Continuous, Harlei Lehrmann, Kristy ONEIDA, MD, Last Rate: 20 mL/hr at 11/14/24 0825, Continued from Pre-op at 11/14/24 0825  Medications Prior to Admission  Medication Sig Dispense Refill Last Dose/Taking   acetaminophen  (TYLENOL ) 500 MG tablet Take 500 mg by mouth at bedtime.      Cholecalciferol (VITAMIN D3) 125 MCG (5000 UT) CAPS Take 10,000 Units by mouth daily.      DHA-EPA-Vitamin E (OMEGA-3 COMPLEX PO) Take 2 capsules by mouth daily. doTerra      ENTRESTO 24-26 MG Take 1 tablet by mouth 2 (two) times daily.      furosemide (LASIX) 20 MG tablet Take 40 mg by mouth daily.      MAGNESIUM PO Take by mouth daily.      mometasone  (ELOCON ) 0.1 % cream Apply 1 Application topically as directed. Qd to Bid to rash on face for 2 weeks, then if clear d/c, if not clear decrease to qd 5 days a week (Patient not taking: Reported on 03/30/2024) 45 g 1    Multiple Vitamin (MULTIVITAMIN) capsule Take 2 capsules by mouth daily. doTerra      Multiple Vitamins-Minerals (CELLULAR SECURITY PO) Take 2 capsules by mouth daily. doTerra      mupirocin  ointment (BACTROBAN ) 2 % Apply 1 application topically daily. With dressing change (Patient not taking: Reported on 03/30/2024) 22 g 0    naproxen  (NAPROSYN ) 500 MG tablet Take PO BID PRN PAIN 30 tablet 0    OVER THE COUNTER MEDICATION 2 capsules at bedtime. doTerra Restful Complex      telmisartan (MICARDIS) 40 MG tablet Take  40 mg by mouth at bedtime.        Allergies  Allergen Reactions   Penicillins Rash    Pt. Has severe rash    Sacubitril-Valsartan Cough   Latex Rash     Past Medical History:  Diagnosis Date   Actinic keratosis    Arthritis    lower back   Carotid artery stenosis    bilateral   CHF (congestive heart failure) (HCC)    Hx of atrial fibrillation without current medication 2014   Hyperlipidemia    Hypertension    Melanoma (HCC) 1991   L forearm    Osteoporosis    PONV (postoperative nausea and vomiting)     Review of systems:  Otherwise negative.    Physical Exam  Gen: Alert, oriented. Appears stated age.  HEENT: PERRLA. Lungs: No respiratory distress CV: RRR Abd: soft, benign, no masses Ext: No edema    Planned procedures: Proceed with colonoscopy. The patient understands the nature of the planned procedure, indications, risks, alternatives and potential complications including but not limited to bleeding, infection, perforation, damage to internal organs and possible oversedation/side effects from anesthesia. The patient agrees and gives consent to proceed.  Please refer to procedure notes for findings, recommendations and patient disposition/instructions.     Kristy ONEIDA Schick, MD [facility]

## 2024-11-14 NOTE — Op Note (Signed)
 [facility] Patient Name: Kristy Ford Procedure Date: 11/14/2024 8:26 AM MRN: [id number] Account #: [id number] Date of Birth: Nov 21, 1956 Admit Type: Outpatient Age: 68 Room: [facility] ENDO ROOM 3 Gender: Female Note Status: Finalized Instrument Name: Colon Scope [id number] Procedure:             Colonoscopy Indications:           High risk colon cancer surveillance: Personal history                         of colonic polyps, Last colonoscopy 5 years ago Providers:             Ole Schick MD, MD Referring MD:          No Local Md, MD (Referring MD) Medicines:             Monitored Anesthesia Care Complications:         No immediate complications. Estimated blood loss:                         Minimal. Procedure:             Pre-Anesthesia Assessment:                        - Prior to the procedure, a History and Physical was                         performed, and patient medications and allergies were                         reviewed. The patient is competent. The risks and                         benefits of the procedure and the sedation options and                         risks were discussed with the patient. All questions                         were answered and informed consent was obtained.                         Patient identification and proposed procedure were                         verified by the physician, the nurse, the                         anesthesiologist, the anesthetist and the technician                         in the endoscopy suite. Mental Status Examination:                         alert and oriented. Airway Examination: normal                         oropharyngeal airway and neck mobility. Respiratory  Examination: clear to auscultation. CV Examination:                         normal. Prophylactic Antibiotics: The patient does not                         require prophylactic antibiotics. Prior                          Anticoagulants: The patient has taken no anticoagulant                         or antiplatelet agents. ASA Grade Assessment: III - A                         patient with severe systemic disease. After reviewing                         the risks and benefits, the patient was deemed in                         satisfactory condition to undergo the procedure. The                         anesthesia plan was to use monitored anesthesia care                         (MAC). Immediately prior to administration of                         medications, the patient was re-assessed for adequacy                         to receive sedatives. The heart rate, respiratory                         rate, oxygen saturations, blood pressure, adequacy of                         pulmonary ventilation, and response to care were                         monitored throughout the procedure. The physical                         status of the patient was re-assessed after the                         procedure.                        After obtaining informed consent, the colonoscope was                         passed under direct vision. Throughout the procedure,                         the patient's blood pressure, pulse, and oxygen  saturations were monitored continuously. The                         Colonoscope was introduced through the anus and                         advanced to the the terminal ileum, with                         identification of the appendiceal orifice and IC                         valve. The colonoscopy was performed without                         difficulty. The patient tolerated the procedure well.                         The quality of the bowel preparation was good. The                         terminal ileum, ileocecal valve, appendiceal orifice,                         and rectum were photographed. Findings:      The perianal and digital rectal  examinations were normal.      The terminal ileum appeared normal.      A 1 mm polyp was found in the descending colon. The polyp was       hyperplastic. The polyp was removed with a jumbo cold forceps. Resection       and retrieval were complete. Estimated blood loss was minimal.      A few small-mouthed diverticula were found in the sigmoid colon.      Internal hemorrhoids were found during retroflexion. The hemorrhoids       were Grade I (internal hemorrhoids that do not prolapse).      The exam was otherwise without abnormality on direct and retroflexion       views. Impression:            - The examined portion of the ileum was normal.                        - One 1 mm polyp in the descending colon, removed with                         a jumbo cold forceps. Resected and retrieved.                        - Diverticulosis in the sigmoid colon.                        - Internal hemorrhoids.                        - The examination was otherwise normal on direct and                         retroflexion views. Recommendation:        - Discharge patient  to home.                        - Resume previous diet.                        - Continue present medications.                        - Await pathology results.                        - Repeat colonoscopy in 3 - 5 years for surveillance.                        - Return to referring physician as previously                         scheduled. Procedure Code(s):     --- Professional ---                        [phone], Colonoscopy, flexible; with biopsy, single or                         multiple Diagnosis Code(s):     --- Professional ---                        Z86.010, Personal history of colonic polyps                        K64.0, First degree hemorrhoids                        D12.4, Benign neoplasm of descending colon                        K57.30, Diverticulosis of large intestine without                         perforation or abscess  without bleeding CPT copyright 2022 [employer]. All rights reserved. The codes documented in this report are preliminary and upon coder review may  be revised to meet current compliance requirements. Ole Schick MD, MD 11/14/2024 8:57:25 AM Number of Addenda: 0 Note Initiated On: 11/14/2024 8:26 AM Scope Withdrawal Time: 0 hours 9 minutes 45 seconds  Total Procedure Duration: 0 hours 14 minutes 30 seconds  Estimated Blood Loss:  Estimated blood loss was minimal.      [facility]

## 2024-11-15 LAB — SURGICAL PATHOLOGY

## 2024-12-31 ENCOUNTER — Encounter: Payer: Self-pay | Admitting: Emergency Medicine

## 2024-12-31 ENCOUNTER — Ambulatory Visit
Admission: EM | Admit: 2024-12-31 | Discharge: 2024-12-31 | Disposition: A | Discharge status: Home / Self Care | Attending: Family Medicine | Admitting: Family Medicine

## 2024-12-31 DIAGNOSIS — J029 Acute pharyngitis, unspecified: Secondary | ICD-10-CM | POA: Insufficient documentation

## 2024-12-31 LAB — POC COVID19/FLU A&B COMBO
Covid Antigen, POC: NEGATIVE
Influenza A Antigen, POC: NEGATIVE
Influenza B Antigen, POC: NEGATIVE

## 2024-12-31 LAB — POCT RAPID STREP A (OFFICE): Rapid Strep A Screen: NEGATIVE

## 2024-12-31 MED ORDER — AZITHROMYCIN 250 MG PO TABS: ORAL_TABLET | ORAL | 0 refills | Status: AC

## 2024-12-31 MED ORDER — AZITHROMYCIN 250 MG PO TABS: ORAL_TABLET | ORAL | 0 refills | Status: DC

## 2024-12-31 NOTE — ED Triage Notes (Signed)
Patient c/o sore throat, nasal congestion, and cough that started on Wed.  Patient denies fevers.

## 2024-12-31 NOTE — ED Provider Notes (Signed)
 " [facility]    CSN: [id number] Arrival date & time: 12/31/24  0810      History   Chief Complaint Chief Complaint  Patient presents with   Sore Throat   Nasal Congestion    HPI  69 year old female presents for evaluation of the above.  Symptoms started Wednesday.  She reports severe sore throat, congestion, and cough.  She is most bothered by sore throat.  Also reports hoarseness.  She also states that she is having bodyaches and ear pain.  No documented fever.  No relieving factors.   Past Medical History:  Diagnosis Date   Actinic keratosis    Arthritis    lower back   Carotid artery stenosis    bilateral   CHF (congestive heart failure) (HCC)    Hx of atrial fibrillation without current medication 2014   Hyperlipidemia    Hypertension    Melanoma (HCC) 1991   L forearm    Osteoporosis    PONV (postoperative nausea and vomiting)     There are no active problems to display for this patient.   Past Surgical History:  Procedure Laterality Date   ABDOMINAL HYSTERECTOMY     CATARACT EXTRACTION W/PHACO Right 11/11/2021   Procedure: CATARACT EXTRACTION PHACO AND INTRAOCULAR LENS PLACEMENT (IOC) RIGHT;  Surgeon: Myrna Adine Anes, MD;  Location: [facility];  Service: Ophthalmology;  Laterality: Right;  9.48 00:47.7   CATARACT EXTRACTION W/PHACO Left 11/25/2021   Procedure: CATARACT EXTRACTION PHACO AND INTRAOCULAR LENS PLACEMENT (IOC) LEFT;  Surgeon: Myrna Adine Anes, MD;  Location: [facility];  Service: Ophthalmology;  Laterality: Left;  2.58 0:21.6   COLONOSCOPY N/A 11/14/2024   Procedure: COLONOSCOPY;  Surgeon: Maryruth Ole DASEN, MD;  Location: [facility];  Service: Endoscopy;  Laterality: N/A;   EYE SURGERY     POLYPECTOMY  11/14/2024   Procedure: POLYPECTOMY, INTESTINE;  Surgeon: Maryruth Ole DASEN, MD;  Location: [facility] END[facility];  Service: Endoscopy;;    OB History   No obstetric history on file.      Home  Medications    Prior to Admission medications  Medication Sig Start Date End Date Taking? Authorizing Provider  Na Sulfate-K Sulfate-Mg Sulfate concentrate (SUPREP) 17.5-3.13-1.6 GM/177ML SOLN Take 1 Bottle by mouth. 10/17/24  Yes [provider]  spironolactone (ALDACTONE) 25 MG tablet Take 25 mg by mouth daily. 11/02/24  Yes [provider]  acetaminophen  (TYLENOL ) 500 MG tablet Take 500 mg by mouth at bedtime.    [provider]  azithromycin  (ZITHROMAX ) 250 MG tablet 2 tablets on Day 1, then 1 tablet daily on Days 2-5. 12/31/24   Bakari Nikolai G, DO  Cholecalciferol (VITAMIN D3) 125 MCG (5000 UT) CAPS Take 10,000 Units by mouth daily.    [provider]  DHA-EPA-Vitamin E (OMEGA-3 COMPLEX PO) Take 2 capsules by mouth daily. doTerra    [provider]  ENTRESTO 24-26 MG Take 1 tablet by mouth 2 (two) times daily.    [provider]  furosemide (LASIX) 20 MG tablet Take 40 mg by mouth daily. 07/28/23   [provider]  JARDIANCE 10 MG TABS tablet Take 10 mg by mouth daily.    [provider]  MAGNESIUM PO Take by mouth daily.    [provider]  mometasone  (ELOCON ) 0.1 % cream Apply 1 Application topically as directed. Qd to Bid to rash on face for 2 weeks, then if clear d/c, if not clear decrease to qd 5 days a week  Patient not taking: Reported on 03/30/2024 04/15/23   Hester Alm BROCKS, MD  Multiple Vitamin (MULTIVITAMIN) capsule Take 2 capsules by mouth daily. doTerra    [provider]  Multiple Vitamins-Minerals (CELLULAR SECURITY PO) Take 2 capsules by mouth daily. doTerra    [provider]  mupirocin  ointment (BACTROBAN ) 2 % Apply 1 application topically daily. With dressing change Patient not taking: Reported on 03/30/2024 08/07/20   Hester Alm BROCKS, MD  naproxen  (NAPROSYN ) 500 MG tablet Take PO BID PRN PAIN 06/20/23   Arvis Huxley B, PA-C  OVER THE COUNTER MEDICATION 2 capsules at bedtime.  doTerra Restful Complex    [provider]  telmisartan (MICARDIS) 40 MG tablet Take 40 mg by mouth at bedtime.    [provider]    Family History Family History  Problem Relation Age of Onset   Breast cancer Mother    Breast cancer Sister     Social History Social History[1]   Allergies   Penicillins, Sacubitril-valsartan, and Latex   Review of Systems Review of Systems Per HPI  Physical Exam Triage Vital Signs ED Triage Vitals  Encounter Vitals Group     BP 12/31/24 0826 134/80     Girls Systolic BP Percentile --      Girls Diastolic BP Percentile --      Boys Systolic BP Percentile --      Boys Diastolic BP Percentile --      Pulse Rate 12/31/24 0826 (!) 115     Resp 12/31/24 0826 15     Temp 12/31/24 0826 98.6 F (37 C)     Temp Source 12/31/24 0826 Oral     SpO2 12/31/24 0826 95 %     Weight 12/31/24 0824 136 lb 3.9 oz (61.8 kg)     Height 12/31/24 0824 5' 2 (1.575 m)     Head Circumference --      Peak Flow --      Pain Score 12/31/24 0824 9     Pain Loc --      Pain Education --      Exclude from Growth Chart --    No data found.  Updated Vital Signs BP 134/80 (BP Location: Right Arm)   Pulse (!) 115   Temp 98.6 F (37 C) (Oral)   Resp 15   Ht 5' 2 (1.575 m)   Wt 61.8 kg   SpO2 95%   BMI 24.92 kg/m   Visual Acuity Right Eye Distance:   Left Eye Distance:   Bilateral Distance:    Right Eye Near:   Left Eye Near:    Bilateral Near:     Physical Exam Vitals and nursing note reviewed.  Constitutional:      General: She is not in acute distress. HENT:     Head: Normocephalic and atraumatic.     Right Ear: Tympanic membrane normal.     Left Ear: Tympanic membrane normal.     Mouth/Throat:     Pharynx: Posterior oropharyngeal erythema present.     Comments: Severe erythema of the oropharynx.  There is visible mucus in the posterior oropharynx. Cardiovascular:     Rate and Rhythm: Regular rhythm. Tachycardia  present.  Pulmonary:     Effort: Pulmonary effort is normal.     Breath sounds: Normal breath sounds. No wheezing or rales.  Neurological:     Mental Status: She is alert.      UC Treatments / Results  Labs (all labs ordered are  listed, but only abnormal results are displayed) Labs Reviewed  POCT RAPID STREP A (OFFICE) - Normal  POC COVID19/FLU A&B COMBO - Normal  CULTURE, GROUP A STREP [facility])    EKG   Radiology No results found.  Procedures Procedures (including critical care time)  Medications Ordered in UC Medications - No data to display  Initial Impression / Assessment and Plan / UC Course  I have reviewed the triage vital signs and the nursing notes.  Pertinent labs & imaging results that were available during my care of the patient were reviewed by me and considered in my medical decision making (see chart for details).    69 year old female presents with the above complaints.  Primary issue is pharyngitis.  Rapid strep is negative.  Flu and COVID testing negative.  Sending strep culture.  Placing on empiric azithromycin  awaiting culture given the severity of her symptoms and exam findings.  Final Clinical Impressions(s) / UC Diagnoses   Final diagnoses:  Pharyngitis, unspecified etiology   Discharge Instructions   None    ED Prescriptions     Medication Sig Dispense Auth. Provider   azithromycin  (ZITHROMAX ) 250 MG tablet  (Status: Discontinued) 2 tablets on Day 1, then 1 tablet daily on Days 2-5. 6 tablet Rei Contee G, DO   azithromycin  (ZITHROMAX ) 250 MG tablet 2 tablets on Day 1, then 1 tablet daily on Days 2-5. 6 tablet Branndon Tuite G, DO      PDMP not reviewed this encounter.    [1]  Social History Tobacco Use   Smoking status: Never   Smokeless tobacco: Never  Vaping Use   Vaping status: Never Used  Substance Use Topics   Alcohol use: Not Currently   Drug use: Never     Khara Renaud G, DO 12/31/24 9065  "

## 2025-01-03 LAB — CULTURE, GROUP A STREP (THRC)

## 2025-03-30 ENCOUNTER — Ambulatory Visit: Admitting: Dermatology
